# Patient Record
Sex: Female | Born: 1985 | Race: White | Hispanic: No | Marital: Single | Smoking: Never smoker
Health system: Southern US, Community
[De-identification: ages and names within clinical notes are randomized; demographics above are authoritative.]

## PROBLEM LIST (undated history)

## (undated) ENCOUNTER — Inpatient Hospital Stay (HOSPITAL_COMMUNITY): Payer: Self-pay

## (undated) DIAGNOSIS — Z8619 Personal history of other infectious and parasitic diseases: Secondary | ICD-10-CM

## (undated) DIAGNOSIS — O139 Gestational [pregnancy-induced] hypertension without significant proteinuria, unspecified trimester: Secondary | ICD-10-CM

## (undated) DIAGNOSIS — Z141 Cystic fibrosis carrier: Secondary | ICD-10-CM

## (undated) DIAGNOSIS — Q210 Ventricular septal defect: Secondary | ICD-10-CM

## (undated) DIAGNOSIS — Q211 Atrial septal defect: Secondary | ICD-10-CM

## (undated) DIAGNOSIS — R51 Headache: Secondary | ICD-10-CM

## (undated) DIAGNOSIS — E669 Obesity, unspecified: Secondary | ICD-10-CM

## (undated) DIAGNOSIS — E785 Hyperlipidemia, unspecified: Secondary | ICD-10-CM

## (undated) DIAGNOSIS — F5101 Primary insomnia: Secondary | ICD-10-CM

## (undated) DIAGNOSIS — Q2119 Other specified atrial septal defect: Secondary | ICD-10-CM

## (undated) DIAGNOSIS — R002 Palpitations: Secondary | ICD-10-CM

## (undated) HISTORY — DX: Ventricular septal defect: Q21.0

## (undated) HISTORY — DX: Headache: R51

## (undated) HISTORY — DX: Cystic fibrosis carrier: Z14.1

## (undated) HISTORY — PX: CARDIAC SURGERY: SHX584

## (undated) HISTORY — DX: Personal history of other infectious and parasitic diseases: Z86.19

## (undated) HISTORY — DX: Primary insomnia: F51.01

## (undated) HISTORY — DX: Palpitations: R00.2

## (undated) HISTORY — DX: Hyperlipidemia, unspecified: E78.5

---
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
## 2010-10-18 ENCOUNTER — Emergency Department (HOSPITAL_COMMUNITY): Payer: BC Managed Care – PPO

## 2010-10-18 ENCOUNTER — Emergency Department (HOSPITAL_COMMUNITY)
Admission: EM | Admit: 2010-10-18 | Discharge: 2010-10-18 | Disposition: A | Discharge status: Home / Self Care | Payer: BC Managed Care – PPO | Attending: Emergency Medicine | Admitting: Emergency Medicine

## 2010-10-18 DIAGNOSIS — R079 Chest pain, unspecified: Secondary | ICD-10-CM | POA: Insufficient documentation

## 2010-10-18 DIAGNOSIS — R0602 Shortness of breath: Secondary | ICD-10-CM | POA: Insufficient documentation

## 2010-10-18 DIAGNOSIS — Z951 Presence of aortocoronary bypass graft: Secondary | ICD-10-CM | POA: Insufficient documentation

## 2010-10-18 LAB — COMPREHENSIVE METABOLIC PANEL
ALT: 30 U/L (ref 0–35)
CO2: 29 mEq/L (ref 19–32)
Calcium: 10.2 mg/dL (ref 8.4–10.5)
Creatinine, Ser: 0.63 mg/dL (ref 0.4–1.2)
GFR calc Af Amer: >60 mL/min (ref >60)
GFR calc non Af Amer: >60 mL/min (ref >60)
Glucose, Bld: 91 mg/dL (ref 70–99)
Sodium: 138 mEq/L (ref 135–145)
Total Protein: 7.5 g/dL (ref 6.0–8.3)

## 2010-10-18 LAB — DIFFERENTIAL
Basophils Absolute: 0 10*3/uL (ref 0.0–0.1)
Basophils Relative: 0 % (ref 0–1)
Eosinophils Absolute: 0.2 10*3/uL (ref 0.0–0.7)
Monocytes Absolute: 0.8 10*3/uL (ref 0.1–1.0)
Neutro Abs: 6.1 10*3/uL (ref 1.7–7.7)
Neutrophils Relative %: 62 % (ref 43–77)

## 2010-10-18 LAB — CK TOTAL AND CKMB (NOT AT ARMC)
CK, MB: 2.4 ng/mL (ref 0.3–4.0)
Total CK: 85 U/L (ref 7–177)

## 2010-10-18 LAB — CBC
Hemoglobin: 12.1 g/dL (ref 12.0–15.0)
MCH: 31 pg (ref 26.0–34.0)
MCHC: 33.2 g/dL (ref 30.0–36.0)
Platelets: 275 10*3/uL (ref 150–400)
RBC: 3.9 MIL/uL (ref 3.87–5.11)

## 2010-10-18 NOTE — ED Provider Notes (Signed)
°

## 2010-10-18 NOTE — ED Notes (Signed)
°

## 2010-10-19 NOTE — Procedures (Signed)
°

## 2010-10-23 NOTE — Procedures (Signed)
°

## 2010-10-26 NOTE — Consent Form (Signed)
°

## 2010-10-26 NOTE — ED Notes (Signed)
°

## 2010-10-26 NOTE — Procedures (Signed)
°

## 2011-04-04 ENCOUNTER — Ambulatory Visit (HOSPITAL_COMMUNITY): Payer: 59 | Attending: Gynecology

## 2011-04-04 ENCOUNTER — Encounter: Payer: Self-pay | Admitting: *Deleted

## 2011-04-04 ENCOUNTER — Emergency Department (HOSPITAL_COMMUNITY)
Admission: EM | Admit: 2011-04-04 | Discharge: 2011-04-04 | Disposition: A | Discharge status: Home / Self Care | Payer: Self-pay | Source: Home / Self Care | Attending: Family Medicine | Admitting: Family Medicine

## 2011-04-04 DIAGNOSIS — J020 Streptococcal pharyngitis: Secondary | ICD-10-CM

## 2011-04-04 HISTORY — DX: Atrial septal defect: Q21.1

## 2011-04-04 HISTORY — DX: Other specified atrial septal defect: Q21.19

## 2011-04-04 LAB — POCT RAPID STREP A: Streptococcus, Group A Screen (Direct): POSITIVE — AB

## 2011-04-04 MED ORDER — AMOXICILLIN 500 MG PO CAPS: 500.0000 mg | ORAL_CAPSULE | Freq: Three times a day (TID) | ORAL | Status: AC

## 2011-04-04 NOTE — ED Notes (Signed)
Pt  Reports  sorethroat  Hurts  To  Swallow   Fever  earlier  This    Am      Dry  Cough

## 2011-04-04 NOTE — ED Provider Notes (Signed)
History     CSN: [id number] Arrival date & time: 04/04/2011  4:31 PM   First MD Initiated Contact with Patient 04/04/11 1603      Chief Complaint  Patient presents with   Sore Throat    (Consider location/radiation/quality/duration/timing/severity/associated sxs/prior treatment) Patient is a 25 y.o. female presenting with pharyngitis. The history is provided by the patient.  Sore Throat This is a new problem. The current episode started yesterday. The problem occurs constantly. The problem has been gradually worsening. The symptoms are aggravated by swallowing. She has tried nothing for the symptoms.    Past Medical History  Diagnosis Date   Atrial septal defect and mitral stenosis     Past Surgical History  Procedure Date   Cardiac surgery     Family History  Problem Relation Age of Onset   Hypertension Father    Stroke Father     History  Substance Use Topics   Smoking status: Never Smoker    Smokeless tobacco: Not on file   Alcohol Use: No    OB History    Grav Para Term Preterm Abortions TAB SAB Ect Mult Living                  Review of Systems  Constitutional: Positive for fever.  HENT: Positive for sore throat.   Gastrointestinal: Negative.   Hematological: Positive for adenopathy.    Allergies  Codeine  Home Medications   Current Outpatient Rx  Name Route Sig Dispense Refill   METFORMIN HCL 500 MG PO TABS Oral Take 500 mg by mouth 2 (two) times daily with a meal.        BP 102/70   Pulse 82   Temp(Src) 98.4 F (36.9 C) (Oral)   Resp 18   SpO2 99%   LMP 03/04/2011  Physical Exam  Nursing note and vitals reviewed. Constitutional: She appears well-developed and well-nourished.  HENT:  Head: Normocephalic.  Right Ear: External ear normal.  Left Ear: External ear normal.  Nose: Nose normal.  Mouth/Throat: Oropharyngeal exudate present.  Eyes: Conjunctivae and EOM are normal. Pupils are equal, round, and reactive to light.   Cardiovascular: Normal rate.   Pulmonary/Chest: Effort normal and breath sounds normal.  Lymphadenopathy:    She has cervical adenopathy.  Skin: Skin is warm and dry.    ED Course  Procedures (including critical care time)  Labs Reviewed  POCT RAPID STREP A ([facility] CARE ONLY) - Abnormal; Notable for the following:    Streptococcus, Group A Screen (Direct) POSITIVE (*)    All other components within normal limits   No results found.   No diagnosis found.    MDM  Strep pos        Barkley Bruns, MD 04/04/11 [phone]

## 2011-04-05 NOTE — Procedures (Signed)
°

## 2011-06-21 ENCOUNTER — Other Ambulatory Visit: Payer: Self-pay

## 2011-06-21 LAB — OB RESULTS CONSOLE ANTIBODY SCREEN: Antibody Screen: NEGATIVE

## 2011-06-21 LAB — OB RESULTS CONSOLE GC/CHLAMYDIA: Gonorrhea: NEGATIVE

## 2011-06-21 LAB — OB RESULTS CONSOLE HIV ANTIBODY (ROUTINE TESTING): HIV: NONREACTIVE

## 2011-06-21 LAB — OB RESULTS CONSOLE RUBELLA ANTIBODY, IGM: Rubella: IMMUNE

## 2011-07-10 LAB — OB RESULTS CONSOLE GC/CHLAMYDIA: Chlamydia: NEGATIVE

## 2011-08-07 ENCOUNTER — Other Ambulatory Visit (HOSPITAL_COMMUNITY): Payer: Self-pay | Admitting: Obstetrics and Gynecology

## 2011-08-07 DIAGNOSIS — Z8774 Personal history of (corrected) congenital malformations of heart and circulatory system: Secondary | ICD-10-CM

## 2011-08-24 ENCOUNTER — Inpatient Hospital Stay (HOSPITAL_COMMUNITY)
Admission: AD | Admit: 2011-08-24 | Discharge: 2011-08-25 | Disposition: A | Discharge status: Home / Self Care | Payer: 59 | Source: Ambulatory Visit | Attending: Obstetrics and Gynecology | Admitting: Obstetrics and Gynecology

## 2011-08-24 ENCOUNTER — Encounter (HOSPITAL_COMMUNITY): Payer: Self-pay | Admitting: Obstetrics and Gynecology

## 2011-08-24 DIAGNOSIS — F419 Anxiety disorder, unspecified: Secondary | ICD-10-CM

## 2011-08-24 DIAGNOSIS — F489 Nonpsychotic mental disorder, unspecified: Secondary | ICD-10-CM

## 2011-08-24 DIAGNOSIS — O99340 Other mental disorders complicating pregnancy, unspecified trimester: Secondary | ICD-10-CM | POA: Insufficient documentation

## 2011-08-24 DIAGNOSIS — F411 Generalized anxiety disorder: Secondary | ICD-10-CM | POA: Insufficient documentation

## 2011-08-24 NOTE — MAU Note (Signed)
"  I've just had this feeling of dread all day.  People keep asking me if I feel the baby moving yet and I don't know if I have or not.  I just having these chest pains and I feel shaky all over and nervous.  I do not have a h/o panic attacks or anxiety."

## 2011-08-25 NOTE — MAU Provider Note (Signed)
History     CSN: [id number]  Arrival date and time: 08/24/11 2245   First Provider Initiated Contact with Patient 08/25/11 0015     26 y.o.G1P0@[redacted]w[redacted]d  Chief Complaint  Patient presents with   Panic Attack   HPI Pt presents to MAU with report of anxiety and worry about pregnancy for last few days.  She has had chest tightness and feeling like she is having heart palpitations since she was crying earlier today.  She denies cramping/contractions, vaginal bleeding, LOF, vaginal itching/burning/discharge, urinary symptoms, n/v, h/a, or fever/chills.  She just states "I am a pessimistic person and since it took [place] so long to get pregnant, I am just waiting for something to go wrong".  "We have had a lot of prenatal visits and it has been almost 3 weeks since our last one and it has never been that long."  OB History    Grav Para Term Preterm Abortions TAB SAB Ect Mult Living   1               Past Medical History  Diagnosis Date   Atrial septal defect and mitral stenosis     Past Surgical History  Procedure Date   Cardiac surgery     Family History  Problem Relation Age of Onset   Hypertension Father    Stroke Father     History  Substance Use Topics   Smoking status: Never Smoker    Smokeless tobacco: Not on file   Alcohol Use: No    Allergies:  Allergies  Allergen Reactions   Codeine     Causes nausea.   Penicillins     Causes nausea.    Prescriptions prior to admission  Medication Sig Dispense Refill   calcium carbonate (TUMS EX) 750 MG chewable tablet Chew 2 tablets by mouth as needed.       Prenatal Vit-Fe Fumarate-FA (PRENATAL MULTIVITAMIN) TABS Take 1 tablet by mouth daily.        Review of Systems  Constitutional: Negative for fever, chills and malaise/fatigue.  Eyes: Negative for blurred vision.  Respiratory: Negative for cough and shortness of breath.   Cardiovascular: Negative for chest pain.  Gastrointestinal: Negative for heartburn,  nausea and vomiting.  Genitourinary: Negative for dysuria, urgency and frequency.  Musculoskeletal: Negative.   Neurological: Negative for dizziness and headaches.  Psychiatric/Behavioral: Negative for depression.  Pt denies hx of anxiety or depression but describes constant worry and pessimistic view of everything  Physical Exam   Blood pressure 129/66, pulse 71, temperature 97.9 F (36.6 C), temperature source Oral, resp. rate 20, height 5\' 6"  (1.676 m), weight 107.14 kg (236 lb 3.2 oz), last menstrual period 03/04/2011.  Physical Exam  Nursing note and vitals reviewed. Constitutional: She is oriented to person, place, and time. She appears well-developed and well-nourished.  Neck: Normal range of motion.  Cardiovascular: Normal rate, regular rhythm and normal heart sounds.   Respiratory: Effort normal and breath sounds normal.  GI: Soft.  Musculoskeletal: Normal range of motion.  Neurological: She is alert and oriented to person, place, and time.  Skin: Skin is warm and dry.  Psychiatric: She has a normal mood and affect. Her behavior is normal. Judgment and thought content normal.    MAU Course  Procedures EKG, bedside sono with pt reassured by fetal movement and FHR  EKG Normal sinus rhythm  Assessment and Plan  Anxiety in pregnancy  D/C home Discussed relaxation techniques and ways to relieve stress Reassured pt of normalcy  of most pregnancies F/U with prenatal provider Return to MAU as needed  LEFTWICH-KIRBY, Avraj Lindroth 08/25/2011, 12:41 AM

## 2011-08-25 NOTE — Discharge Instructions (Signed)
Pregnancy - Second Trimester The second trimester of pregnancy (3 to 6 months) is a period of rapid growth for you and your baby. At the end of the sixth month, your baby is about 9 inches long and weighs 1 1/2 pounds. You will begin to feel the baby move between 18 and 20 weeks of the pregnancy. This is called quickening. Weight gain is faster. A clear fluid (colostrum) may leak out of your breasts. You may feel small contractions of the womb (uterus). This is known as false labor or Braxton-Hicks contractions. This is like a practice for labor when the baby is ready to be born. Usually, the problems with morning sickness have usually passed by the end of your first trimester. Some women develop small dark blotches (called cholasma, mask of pregnancy) on their face that usually goes away after the baby is born. Exposure to the sun makes the blotches worse. Acne may also develop in some pregnant women and pregnant women who have acne, may find that it goes away. PRENATAL EXAMS  Blood work may continue to be done during prenatal exams. These tests are done to check on your health and the probable health of your baby. Blood work is used to follow your blood levels (hemoglobin). Anemia (low hemoglobin) is common during pregnancy. Iron and vitamins are given to help prevent this. You will also be checked for diabetes between 24 and 28 weeks of the pregnancy. Some of the previous blood tests may be repeated.   The size of the uterus is measured during each visit. This is to make sure that the baby is continuing to grow properly according to the dates of the pregnancy.   Your blood pressure is checked every prenatal visit. This is to make sure you are not getting toxemia.   Your urine is checked to make sure you do not have an infection, diabetes or protein in the urine.   Your weight is checked often to make sure gains are happening at the suggested rate. This is to ensure that both you and your baby are  growing normally.   Sometimes, an ultrasound is performed to confirm the proper growth and development of the baby. This is a test which bounces harmless sound waves off the baby so your caregiver can more accurately determine due dates.  Sometimes, a specialized test is done on the amniotic fluid surrounding the baby. This test is called an amniocentesis. The amniotic fluid is obtained by sticking a needle into the belly (abdomen). This is done to check the chromosomes in instances where there is a concern about possible genetic problems with the baby. It is also sometimes done near the end of pregnancy if an early delivery is required. In this case, it is done to help make sure the baby's lungs are mature enough for the baby to live outside of the womb. CHANGES OCCURING IN THE SECOND TRIMESTER OF PREGNANCY Your body goes through many changes during pregnancy. They vary from person to person. Talk to your caregiver about changes you notice that you are concerned about.  During the second trimester, you will likely have an increase in your appetite. It is normal to have cravings for certain foods. This varies from person to person and pregnancy to pregnancy.   Your lower abdomen will begin to bulge.   You may have to urinate more often because the uterus and baby are pressing on your bladder. It is also common to get more bladder infections during pregnancy (  pain with urination). You can help this by drinking lots of fluids and emptying your bladder before and after intercourse.   You may begin to get stretch marks on your hips, abdomen, and breasts. These are normal changes in the body during pregnancy. There are no exercises or medications to take that prevent this change.   You may begin to develop swollen and bulging veins (varicose veins) in your legs. Wearing support hose, elevating your feet for 15 minutes, 3 to 4 times a day and limiting salt in your diet helps lessen the problem.    Heartburn may develop as the uterus grows and pushes up against the stomach. Antacids recommended by your caregiver helps with this problem. Also, eating smaller meals 4 to 5 times a day helps.   Constipation can be treated with a stool softener or adding bulk to your diet. Drinking lots of fluids, vegetables, fruits, and whole grains are helpful.   Exercising is also helpful. If you have been very active up until your pregnancy, most of these activities can be continued during your pregnancy. If you have been less active, it is helpful to start an exercise program such as walking.   Hemorrhoids (varicose veins in the rectum) may develop at the end of the second trimester. Warm sitz baths and hemorrhoid cream recommended by your caregiver helps hemorrhoid problems.   Backaches may develop during this time of your pregnancy. Avoid heavy lifting, wear low heal shoes and practice good posture to help with backache problems.   Some pregnant women develop tingling and numbness of their hand and fingers because of swelling and tightening of ligaments in the wrist (carpel tunnel syndrome). This goes away after the baby is born.   As your breasts enlarge, you may have to get a bigger bra. Get a comfortable, cotton, support bra. Do not get a nursing bra until the last month of the pregnancy if you will be nursing the baby.   You may get a dark line from your belly button to the pubic area called the linea nigra.   You may develop rosy cheeks because of increase blood flow to the face.   You may develop spider looking lines of the face, neck, arms and chest. These go away after the baby is born.  HOME CARE INSTRUCTIONS   It is extremely important to avoid all smoking, herbs, alcohol, and unprescribed drugs during your pregnancy. These chemicals affect the formation and growth of the baby. Avoid these chemicals throughout the pregnancy to ensure the delivery of a healthy infant.   Most of your home  care instructions are the same as suggested for the first trimester of your pregnancy. Keep your caregiver's appointments. Follow your caregiver's instructions regarding medication use, exercise and diet.   During pregnancy, you are providing food for you and your baby. Continue to eat regular, well-balanced meals. Choose foods such as meat, fish, milk and other low fat dairy products, vegetables, fruits, and whole-grain breads and cereals. Your caregiver will tell you of the ideal weight gain.   A physical sexual relationship may be continued up until near the end of pregnancy if there are no other problems. Problems could include early (premature) leaking of amniotic fluid from the membranes, vaginal bleeding, abdominal pain, or other medical or pregnancy problems.   Exercise regularly if there are no restrictions. Check with your caregiver if you are unsure of the safety of some of your exercises. The greatest weight gain will occur in the   last 2 trimesters of pregnancy. Exercise will help you:   Control your weight.   Get you in shape for labor and delivery.   Lose weight after you have the baby.   Wear a good support or jogging bra for breast tenderness during pregnancy. This may help if worn during sleep. Pads or tissues may be used in the bra if you are leaking colostrum.   Do not use hot tubs, steam rooms or saunas throughout the pregnancy.   Wear your seat belt at all times when driving. This protects you and your baby if you are in an accident.   Avoid raw meat, uncooked cheese, cat litter boxes and soil used by cats. These carry germs that can cause birth defects in the baby.   The second trimester is also a good time to visit your dentist for your dental health if this has not been done yet. Getting your teeth cleaned is OK. Use a soft toothbrush. Brush gently during pregnancy.   It is easier to loose urine during pregnancy. Tightening up and strengthening the pelvic muscles will  help with this problem. Practice stopping your urination while you are going to the bathroom. These are the same muscles you need to strengthen. It is also the muscles you would use as if you were trying to stop from passing gas. You can practice tightening these muscles up 10 times a set and repeating this about 3 times per day. Once you know what muscles to tighten up, do not perform these exercises during urination. It is more likely to contribute to an infection by backing up the urine.   Ask for help if you have financial, counseling or nutritional needs during pregnancy. Your caregiver will be able to offer counseling for these needs as well as refer you for other special needs.   Your skin may become oily. If so, wash your face with mild soap, use non-greasy moisturizer and oil or cream based makeup.  MEDICATIONS AND DRUG USE IN PREGNANCY  Take prenatal vitamins as directed. The vitamin should contain 1 milligram of folic acid. Keep all vitamins out of reach of children. Only a couple vitamins or tablets containing iron may be fatal to a baby or young child when ingested.   Avoid use of all medications, including herbs, over-the-counter medications, not prescribed or suggested by your caregiver. Only take over-the-counter or prescription medicines for pain, discomfort, or fever as directed by your caregiver. Do not use aspirin.   Let your caregiver also know about herbs you may be using.   Alcohol is related to a number of birth defects. This includes fetal alcohol syndrome. All alcohol, in any form, should be avoided completely. Smoking will cause low birth rate and premature babies.   Street or illegal drugs are very harmful to the baby. They are absolutely forbidden. A baby born to an addicted mother will be addicted at birth. The baby will go through the same withdrawal an adult does.  SEEK MEDICAL CARE IF:  You have any concerns or worries during your pregnancy. It is better to call with  your questions if you feel they cannot wait, rather than worry about them. SEEK IMMEDIATE MEDICAL CARE IF:   An unexplained oral temperature above 102 F (38.9 C) develops, or as your caregiver suggests.   You have leaking of fluid from the vagina (birth canal). If leaking membranes are suspected, take your temperature and tell your caregiver of this when you call.   There   is vaginal spotting, bleeding, or passing clots. Tell your caregiver of the amount and how many pads are used. Light spotting in pregnancy is common, especially following intercourse.   You develop a bad smelling vaginal discharge with a change in the color from clear to white.   You continue to feel sick to your stomach (nauseated) and have no relief from remedies suggested. You vomit blood or coffee ground-like materials.   You lose more than 2 pounds of weight or gain more than 2 pounds of weight over 1 week, or as suggested by your caregiver.   You notice swelling of your face, hands, feet, or legs.   You get exposed to [place] measles and have never had them.   You are exposed to fifth disease or chickenpox.   You develop belly (abdominal) pain. Round ligament discomfort is a common non-cancerous (benign) cause of abdominal pain in pregnancy. Your caregiver still must evaluate you.   You develop a bad headache that does not go away.   You develop fever, diarrhea, pain with urination, or shortness of breath.   You develop visual problems, blurry, or double vision.   You fall or are in a car accident or any kind of trauma.   There is mental or physical violence at home.  Document Released: 04/17/2001 Document Revised: 04/12/2011 Document Reviewed: 10/20/2008 [facility] Patient Information 2012 [place], [place].  Anxiety and Panic Attacks Your caregiver has informed you that you are having an anxiety or panic attack. There may be many forms of this. Most of the time these attacks come suddenly and without  warning. They come at any time of day, including periods of sleep, and at any time of life. They may be strong and unexplained. Although panic attacks are very scary, they are physically harmless. Sometimes the cause of your anxiety is not known. Anxiety is a protective mechanism of the body in its fight or flight mechanism. Most of these perceived danger situations are actually nonphysical situations (such as anxiety over losing a job). CAUSES  The causes of an anxiety or panic attack are many. Panic attacks may occur in otherwise healthy people given a certain set of circumstances. There may be a genetic cause for panic attacks. Some medications may also have anxiety as a side effect. SYMPTOMS  Some of the most common feelings are:  Intense terror.   Dizziness, feeling faint.   Hot and cold flashes.   Fear of going crazy.   Feelings that nothing is real.   Sweating.   Shaking.   Chest pain or a fast heartbeat (palpitations).   Smothering, choking sensations.   Feelings of impending doom and that death is near.   Tingling of extremities, this may be from over-breathing.   Altered reality (derealization).   Being detached from yourself (depersonalization).  Several symptoms can be present to make up anxiety or panic attacks. DIAGNOSIS  The evaluation by your caregiver will depend on the type of symptoms you are experiencing. The diagnosis of anxiety or panic attack is made when no physical illness can be determined to be a cause of the symptoms. TREATMENT  Treatment to prevent anxiety and panic attacks may include:  Avoidance of circumstances that cause anxiety.   Reassurance and relaxation.   Regular exercise.   Relaxation therapies, such as yoga.   Psychotherapy with a psychiatrist or therapist.   Avoidance of caffeine, alcohol and illegal drugs.   Prescribed medication.  SEEK IMMEDIATE MEDICAL CARE IF:   You experience  panic attack symptoms that are different  than your usual symptoms.   You have any worsening or concerning symptoms.  Document Released: 04/23/2005 Document Revised: 04/12/2011 Document Reviewed: 08/25/2009 [facility] Patient Information 2012 [place], [place].

## 2011-09-04 ENCOUNTER — Encounter (HOSPITAL_COMMUNITY): Payer: Self-pay

## 2011-09-04 ENCOUNTER — Ambulatory Visit (HOSPITAL_COMMUNITY)
Admission: RE | Admit: 2011-09-04 | Discharge: 2011-09-04 | Disposition: A | Discharge status: Home / Self Care | Payer: 59 | Source: Ambulatory Visit | Attending: Obstetrics and Gynecology | Admitting: Obstetrics and Gynecology

## 2011-09-04 DIAGNOSIS — O358XX0 Maternal care for other (suspected) fetal abnormality and damage, not applicable or unspecified: Secondary | ICD-10-CM | POA: Insufficient documentation

## 2011-09-04 DIAGNOSIS — O352XX0 Maternal care for (suspected) hereditary disease in fetus, not applicable or unspecified: Secondary | ICD-10-CM | POA: Insufficient documentation

## 2011-09-04 DIAGNOSIS — Z8774 Personal history of (corrected) congenital malformations of heart and circulatory system: Secondary | ICD-10-CM

## 2011-09-04 DIAGNOSIS — Z1389 Encounter for screening for other disorder: Secondary | ICD-10-CM | POA: Insufficient documentation

## 2011-09-04 DIAGNOSIS — Z363 Encounter for antenatal screening for malformations: Secondary | ICD-10-CM | POA: Insufficient documentation

## 2011-10-21 ENCOUNTER — Encounter (HOSPITAL_COMMUNITY): Payer: Self-pay | Admitting: *Deleted

## 2011-10-21 ENCOUNTER — Inpatient Hospital Stay (HOSPITAL_COMMUNITY)
Admission: AD | Admit: 2011-10-21 | Discharge: 2011-10-21 | Disposition: A | Discharge status: Home / Self Care | Payer: 59 | Source: Ambulatory Visit | Attending: Obstetrics and Gynecology | Admitting: Obstetrics and Gynecology

## 2011-10-21 DIAGNOSIS — M549 Dorsalgia, unspecified: Secondary | ICD-10-CM | POA: Insufficient documentation

## 2011-10-21 DIAGNOSIS — O479 False labor, unspecified: Secondary | ICD-10-CM

## 2011-10-21 DIAGNOSIS — R109 Unspecified abdominal pain: Secondary | ICD-10-CM | POA: Insufficient documentation

## 2011-10-21 DIAGNOSIS — O99891 Other specified diseases and conditions complicating pregnancy: Secondary | ICD-10-CM | POA: Insufficient documentation

## 2011-10-21 LAB — URINALYSIS, ROUTINE W REFLEX MICROSCOPIC
Hgb urine dipstick: NEGATIVE
Leukocytes, UA: NEGATIVE
Nitrite: NEGATIVE
Protein, ur: NEGATIVE mg/dL
Specific Gravity, Urine: <1.005 — ABNORMAL LOW (ref 1.005–1.030)
Urobilinogen, UA: 0.2 mg/dL (ref 0.0–1.0)

## 2011-10-21 NOTE — MAU Note (Signed)
"  I'm having some abd pain and pressure since 1300.  It hadn't been very consistent, but worrisome."

## 2011-10-21 NOTE — Discharge Instructions (Signed)
Braxton Hicks Contractions Pregnancy is commonly associated with contractions of the uterus throughout the pregnancy. Towards the end of pregnancy (32 to 34 weeks), these contractions (Braxton Hicks) can develop more often and may become more forceful. This is not true labor because these contractions do not result in opening (dilatation) and thinning of the cervix. They are sometimes difficult to tell apart from true labor because these contractions can be forceful and people have different pain tolerances. You should not feel embarrassed if you go to the hospital with false labor. Sometimes, the only way to tell if you are in true labor is for your caregiver to follow the changes in the cervix. How to tell the difference between true and false labor:  False labor.   The contractions of false labor are usually shorter, irregular and not as hard as those of true labor.   They are often felt in the front of the lower abdomen and in the groin.   They may leave with walking around or changing positions while lying down.   They get weaker and are shorter lasting as time goes on.   These contractions are usually irregular.   They do not usually become progressively stronger, regular and closer together as with true labor.   True labor.   Contractions in true labor last 30 to 70 seconds, become very regular, usually become more intense, and increase in frequency.   They do not go away with walking.   The discomfort is usually felt in the top of the uterus and spreads to the lower abdomen and low back.   True labor can be determined by your caregiver with an exam. This will show that the cervix is dilating and getting thinner.  If there are no prenatal problems or other health problems associated with the pregnancy, it is completely safe to be sent home with false labor and await the onset of true labor. HOME CARE INSTRUCTIONS   Keep up with your usual exercises and instructions.   Take  medications as directed.   Keep your regular prenatal appointment.   Eat and drink lightly if you think you are going into labor.   If BH contractions are making you uncomfortable:   Change your activity position from lying down or resting to walking/walking to resting.   Sit and rest in a tub of warm water.   Drink 2 to 3 glasses of water. Dehydration may cause B-H contractions.   Do slow and deep breathing several times an hour.  SEEK IMMEDIATE MEDICAL CARE IF:   Your contractions continue to become stronger, more regular, and closer together.   You have a gushing, burst or leaking of fluid from the vagina.   An oral temperature above 102 F (38.9 C) develops.   You have passage of blood-tinged mucus.   You develop vaginal bleeding.   You develop continuous belly (abdominal) pain.   You have low back pain that you never had before.   You feel the baby's head pushing down causing pelvic pressure.   The baby is not moving as much as it used to.  Document Released: 04/23/2005 Document Revised: 04/12/2011 Document Reviewed: 10/15/2008 Exit[employer] Patient Information 2012 [employer].Preterm Labor Preterm labor is when labor starts at less than 37 weeks of pregnancy. The normal length of a pregnancy is 39 to 41 weeks. CAUSES Often, there is no identifiable underlying cause as to why a woman goes into preterm labor. However, one of the most common known causes   of preterm labor is infection. Infections of the uterus, cervix, vagina, amniotic sac, bladder, kidney, or even the lungs (pneumonia) can cause labor to start. Other causes of preterm labor include:  Urogenital infections, such as yeast infections and bacterial vaginosis.   Uterine abnormalities (uterine shape, uterine septum, fibroids, bleeding from the placenta).   A cervix that has been operated on and opens prematurely.   Malformations in the baby.   Multiple gestations (twins, triplets, and so on).    Breakage of the amniotic sac.  Additional risk factors for preterm labor include:  Previous history of preterm labor.   Premature rupture of membranes (PROM).   A placenta that covers the opening of the cervix (placenta previa).   A placenta that separates from the uterus (placenta abruption).   A cervix that is too weak to hold the baby in the uterus (incompetence cervix).   Having too much fluid in the amniotic sac (polyhydramnios).   Taking illegal drugs or smoking while pregnant.   Not gaining enough weight while pregnant.   Women younger than 18 and older than 27 years old.   Low socioeconomic status.   African-American ethnicity.  SYMPTOMS Signs and symptoms of preterm labor include:  Menstrual-like cramps.   Contractions that are 30 to 70 seconds apart, become very regular, closer together, and are more intense and painful.   Contractions that start on the top of the uterus and spread down to the lower abdomen and back.   A sense of increased pelvic pressure or back pain.   A watery or bloody discharge that comes from the vagina.  DIAGNOSIS  A diagnosis can be confirmed by:  A vaginal exam.   An ultrasound of the cervix.   Sampling (swabbing) cervico-vaginal secretions. These samples can be tested for the presence of fetal fibronectin. This is a protein found in cervical discharge which is associated with preterm labor.   Fetal monitoring.  TREATMENT  Depending on the length of the pregnancy and other circumstances, a caregiver may suggest bed rest. If necessary, there are medicines that can be given to stop contractions and to quicken fetal lung maturity. If labor happens before 34 weeks of pregnancy, a prolonged hospital stay may be recommended. Treatment depends on the condition of both the mother and baby. PREVENTION There are some things a mother can do to lower the risk of preterm labor in future pregnancies. A woman can:   Stop smoking.    Maintain healthy weight gain and avoid chemicals and drugs that are not necessary.   Be watchful for any type of infection.   Inform her caregiver if she has a known history of preterm labor.  Document Released: 07/14/2003 Document Revised: 04/12/2011 Document Reviewed: 08/18/2010 Exit[employer] Patient Information 2012 [employer]. 

## 2011-10-21 NOTE — MAU Provider Note (Signed)
Chief Complaint:  Back Pain and Abdominal Cramping    First Provider Initiated Contact with Patient 10/21/11 2122      Kim Sharp is  26 y.o. G1P0.  Patient's last menstrual period was 04/25/2011..  [redacted]w[redacted]d  She presents complaining of Back Pain and Abdominal Cramping . Onset is described as sudden back pain and lower abd cramping at 1300 today, resolved spontaneously. Reports good FM, denies bleeding or LOL. Next OB appt with Dr. Renaldo Fiddler in 2 weeks.  Obstetrical/Gynecological History: OB History    Grav Para Term Preterm Abortions TAB SAB Ect Mult Living   1               Past Medical History: Past Medical History  Diagnosis Date  . Atrial septal defect and mitral stenosis     Past Surgical History: Past Surgical History  Procedure Date  . Cardiac surgery     Family History: Family History  Problem Relation Age of Onset  . Hypertension Father   . Stroke Father     Social History: History  Substance Use Topics  . Smoking status: Never Smoker   . Smokeless tobacco: Not on file  . Alcohol Use: No    Allergies:  Allergies  Allergen Reactions  . Codeine     Causes nausea.  . Penicillins     Causes nausea.    Prescriptions prior to admission  Medication Sig Dispense Refill  . acetaminophen (TYLENOL) 325 MG tablet Take 325 mg by mouth every 6 (six) hours as needed. pain      . calcium carbonate (TUMS EX) 750 MG chewable tablet Chew 2 tablets by mouth as needed.      . Prenatal Vit-Fe Fumarate-FA (PRENATAL MULTIVITAMIN) TABS Take 1 tablet by mouth at bedtime.         Review of Systems - Negative except what has been reviewed in the HPI   Physical Exam   Blood pressure 136/69, pulse 87, temperature 97.4 F (36.3 C), temperature source Oral, resp. rate 18, height 5\' 6"  (1.676 m), weight 245 lb (111.131 kg), last menstrual period 04/25/2011.  General: General appearance - alert, well appearing, and in no distress, oriented to person, place, and time and  overweight Mental status - alert, oriented to person, place, and time, normal mood, behavior, speech, dress, motor activity, and thought processes, affect appropriate to mood Abdomen - obese, gravid, soft, non tender Focused Gynecological Exam: CERVIX: closed/thick/post  Labs: Recent Results (from the past 24 hour(s))  URINALYSIS, ROUTINE W REFLEX MICROSCOPIC   Collection Time   10/21/11  7:24 PM      Component Value Range   Color, Urine YELLOW  YELLOW   APPearance CLEAR  CLEAR   Specific Gravity, Urine <1.005 (*) 1.005 - 1.030   pH 6.5  5.0 - 8.0   Glucose, UA NEGATIVE  NEGATIVE mg/dL   Hgb urine dipstick NEGATIVE  NEGATIVE   Bilirubin Urine NEGATIVE  NEGATIVE   Ketones, ur NEGATIVE  NEGATIVE mg/dL   Protein, ur NEGATIVE  NEGATIVE mg/dL   Urobilinogen, UA 0.2  0.0 - 1.0 mg/dL   Nitrite NEGATIVE  NEGATIVE   Leukocytes, UA NEGATIVE  NEGATIVE   MD Consult: Discussed with Dr. Marcelle Overlie  Assessment: Back pain in pregnancy Anxiety  Plan: Discharge home PTL precautions FU in office as scheduled  [name] E. 10/21/2011,9:31 PM

## 2011-10-21 NOTE — MAU Note (Signed)
Pt G1 at 25.4wks having lower back pain and lower abd cramping since 1300 today.  Pain comes and goes.  Denies bleeding or leaking.

## 2011-11-08 ENCOUNTER — Encounter (HOSPITAL_COMMUNITY): Payer: Self-pay | Admitting: *Deleted

## 2011-11-08 ENCOUNTER — Inpatient Hospital Stay (HOSPITAL_COMMUNITY)
Admission: AD | Admit: 2011-11-08 | Discharge: 2011-11-09 | Disposition: A | Discharge status: Home / Self Care | Payer: 59 | Source: Ambulatory Visit | Attending: Obstetrics and Gynecology | Admitting: Obstetrics and Gynecology

## 2011-11-08 DIAGNOSIS — O368190 Decreased fetal movements, unspecified trimester, not applicable or unspecified: Secondary | ICD-10-CM

## 2011-11-08 NOTE — MAU Note (Signed)
No fetal movement since 1700 today. Denies contractions, vaginal bleeding or discharge. Denies complications with pregnancy.

## 2011-11-09 DIAGNOSIS — O368190 Decreased fetal movements, unspecified trimester, not applicable or unspecified: Secondary | ICD-10-CM

## 2011-11-09 NOTE — MAU Provider Note (Signed)
°  History     CSN: [id number]  Arrival date and time: 11/08/11 2318   None     No chief complaint on file.  HPI This is a 26 year old G1 P0 at 51 weeks and one-day who presents the MAU with decreased fetal activity since 5:00 last night. She has not felt any movement today. She tried to drink soda and do kick counts but did not feel the baby move. She came over here from work to be evaluated. No palliating or provoking factors. Denies contractions, nausea, vomiting, leaking fluid, vaginal discharge.  OB History    Grav Para Term Preterm Abortions TAB SAB Ect Mult Living   1               Past Medical History  Diagnosis Date   Atrial septal defect and mitral stenosis     Past Surgical History  Procedure Date   Cardiac surgery     Family History  Problem Relation Age of Onset   Hypertension Father    Stroke Father     History  Substance Use Topics   Smoking status: Never Smoker    Smokeless tobacco: Not on file   Alcohol Use: No    Allergies:  Allergies  Allergen Reactions   Codeine     Causes nausea.   Penicillins     Causes nausea.    Prescriptions prior to admission  Medication Sig Dispense Refill   calcium carbonate (TUMS EX) 750 MG chewable tablet Chew 2 tablets by mouth as needed.       Prenatal Vit-Fe Fumarate-FA (PRENATAL MULTIVITAMIN) TABS Take 1 tablet by mouth at bedtime.        acetaminophen (TYLENOL) 325 MG tablet Take 325 mg by mouth every 6 (six) hours as needed. pain        Review of Systems  All other systems reviewed and are negative.   Physical Exam   Last menstrual period 04/25/2011.  Physical Exam  Constitutional: She is oriented to person, place, and time. She appears well-developed and well-nourished.  HENT:  Head: Normocephalic and atraumatic.  GI: Soft. She exhibits no distension and no mass. There is no tenderness. There is no rebound and no guarding.  Neurological: She is alert and oriented to person, place,  and time.  Skin: Skin is warm and dry.  Psychiatric: She has a normal mood and affect. Her behavior is normal. Judgment and thought content normal.    MAU Course  Procedures NST: Baseline rate of 120s to 30s. Category 1 tracing. Accelerations seen. No contractions on tocometry. Fetal movement heard On Doppler, which the patient felt.  MDM   Assessment and Plan  #1 decreased fetal activity  Patient reassured with feeling movement in combination with fetal heart tones. Patient discharged to home with kick count instructions. Patient to followup with primary obstetrician scheduled appointment. Patient to return with decreased fetal activity.  Mayara Paulson JEHIEL 11/09/2011, 12:12 AM

## 2011-11-09 NOTE — Consent Form (Signed)
°

## 2011-11-09 NOTE — Miscellaneous (Signed)
°

## 2011-11-09 NOTE — Consent Form (Deleted)
°

## 2011-12-24 LAB — OB RESULTS CONSOLE GBS: GBS: POSITIVE

## 2012-01-14 ENCOUNTER — Inpatient Hospital Stay (HOSPITAL_COMMUNITY)
Admission: AD | Admit: 2012-01-14 | Discharge: 2012-01-14 | Disposition: A | Discharge status: Home / Self Care | Payer: 59 | Source: Ambulatory Visit | Attending: Obstetrics and Gynecology | Admitting: Obstetrics and Gynecology

## 2012-01-14 ENCOUNTER — Other Ambulatory Visit: Payer: Self-pay

## 2012-01-14 ENCOUNTER — Encounter (HOSPITAL_COMMUNITY): Payer: Self-pay | Admitting: *Deleted

## 2012-01-14 DIAGNOSIS — O479 False labor, unspecified: Secondary | ICD-10-CM | POA: Insufficient documentation

## 2012-01-14 DIAGNOSIS — R079 Chest pain, unspecified: Secondary | ICD-10-CM | POA: Insufficient documentation

## 2012-01-14 DIAGNOSIS — R109 Unspecified abdominal pain: Secondary | ICD-10-CM | POA: Insufficient documentation

## 2012-01-14 DIAGNOSIS — O99891 Other specified diseases and conditions complicating pregnancy: Secondary | ICD-10-CM | POA: Insufficient documentation

## 2012-01-14 MED ORDER — GI COCKTAIL ~~LOC~~
30.0000 mL | Freq: Once | ORAL | Status: AC
  Administered 2012-01-14: 30 mL via ORAL
  Filled 2012-01-14: qty 30

## 2012-01-14 NOTE — MAU Note (Signed)
Pt reports contractions all day off/on, this pm had onset of severe pain that was constant. Now having contractions about 7 minutes, pain radiates into her lower back. With every contraction has pressure in her chest and nausea.

## 2012-01-14 NOTE — MAU Provider Note (Signed)
°  History     CSN: [id number]  Arrival date and time: 01/14/12 2016   First Provider Initiated Contact with Patient 01/14/12 2101      Chief Complaint  Patient presents with   Labor Eval   HPI Valicia Rief is a 26 y.o. female @ [redacted]w[redacted]d gestation who presents to MAU with chest pain and contractions. She states that the contractions started earlier today and were about every 8 minutes then tonight are closer. The chest pain comes during the contractions and feels like a ton of bricks sitting on the chest. Hx of VSD and ASD. Surgery years ago and was released by the cardiologist. The history was provided by the patient.  OB History    Grav Para Term Preterm Abortions TAB SAB Ect Mult Living   1               Past Medical History  Diagnosis Date   Atrial septal defect and mitral stenosis     Past Surgical History  Procedure Date   Cardiac surgery     Family History  Problem Relation Age of Onset   Hypertension Father    Stroke Father     History  Substance Use Topics   Smoking status: Never Smoker    Smokeless tobacco: Never Used   Alcohol Use: No    Allergies:  Allergies  Allergen Reactions   Codeine Nausea Only    Causes nausea.   Penicillins Nausea Only    Causes nausea.    Prescriptions prior to admission  Medication Sig Dispense Refill   calcium carbonate (TUMS EX) 750 MG chewable tablet Chew 1 tablet by mouth 2 (two) times daily as needed. For heartburn       Prenatal Vit-Fe Fumarate-FA (PRENATAL MULTIVITAMIN) TABS Take 1 tablet by mouth at bedtime.         ROS: As stated in HPI Blood pressure 134/95, pulse 94, temperature 97.8 F (36.6 C), temperature source Oral, resp. rate 20, height 5\' 7"  (1.702 m), weight 264 lb (119.75 kg), last menstrual period 04/25/2011, SpO2 100.00%.  Physical Exam  Nursing note and vitals reviewed. Constitutional: She is oriented to person, place, and time. She appears well-developed and well-nourished. No  distress.       BP elevated initially.  HENT:  Head: Normocephalic.  Cardiovascular: Normal rate and regular rhythm.   Respiratory: Effort normal. She has no wheezes. She has no rales.  GI: Soft. There is no tenderness.       Gravid consistent with dates  Genitourinary:       Dilation: Fingertip Effacement (%): Thick Cervical Position: Posterior (to left) Station: -3 Presentation: Vertex Exam by:: L. Paschal, RN  Musculoskeletal: Normal range of motion.  Neurological: She is alert and oriented to person, place, and time.  Skin: Skin is warm and dry.  Psychiatric: She has a normal mood and affect. Her behavior is normal. Judgment and thought content normal.   EKG: normal Assessment: 26 y.o. female @ [redacted]w[redacted]d gestation with abdominal pain    Chest pain only with contractions  Plan:  Medical screening exam complete and Dr. Marcelle Overlie to continue care of patient.    RN to discuss monitor tracing and cervical exam with Dr. Marcelle Overlie MAU Course  Procedures  Lylie Blacklock, RN, FNP, [facility] 01/14/2012, 9:07 PM

## 2012-01-15 NOTE — Consent Form (Signed)
°

## 2012-01-15 NOTE — Miscellaneous (Signed)
°

## 2012-01-15 NOTE — Procedures (Signed)
°

## 2012-01-19 ENCOUNTER — Encounter (HOSPITAL_COMMUNITY): Payer: Self-pay | Admitting: *Deleted

## 2012-01-19 ENCOUNTER — Inpatient Hospital Stay (HOSPITAL_COMMUNITY)
Admission: AD | Admit: 2012-01-19 | Discharge: 2012-01-19 | Disposition: A | Discharge status: Home / Self Care | Payer: 59 | Source: Ambulatory Visit | Attending: Obstetrics and Gynecology | Admitting: Obstetrics and Gynecology

## 2012-01-19 DIAGNOSIS — O479 False labor, unspecified: Secondary | ICD-10-CM | POA: Insufficient documentation

## 2012-01-19 NOTE — MAU Note (Signed)
Pt presents for bloody show, perineal pressure and contractions.

## 2012-01-20 NOTE — Miscellaneous (Signed)
°

## 2012-01-22 ENCOUNTER — Encounter (HOSPITAL_COMMUNITY): Payer: Self-pay | Admitting: *Deleted

## 2012-01-22 ENCOUNTER — Telehealth (HOSPITAL_COMMUNITY): Payer: Self-pay | Admitting: *Deleted

## 2012-01-22 NOTE — Telephone Encounter (Signed)
Preadmission screen ° °

## 2012-01-24 ENCOUNTER — Encounter (HOSPITAL_COMMUNITY): Payer: Self-pay

## 2012-01-24 ENCOUNTER — Inpatient Hospital Stay (HOSPITAL_COMMUNITY)
Admission: RE | Admit: 2012-01-24 | Discharge: 2012-01-28 | DRG: 765 | Disposition: A | Discharge status: Home / Self Care | Payer: 59 | Source: Ambulatory Visit | Attending: Obstetrics and Gynecology | Admitting: Obstetrics and Gynecology

## 2012-01-24 DIAGNOSIS — O339 Maternal care for disproportion, unspecified: Secondary | ICD-10-CM | POA: Diagnosis present

## 2012-01-24 DIAGNOSIS — O139 Gestational [pregnancy-induced] hypertension without significant proteinuria, unspecified trimester: Secondary | ICD-10-CM | POA: Diagnosis present

## 2012-01-24 DIAGNOSIS — Z22330 Carrier of Group B streptococcus: Secondary | ICD-10-CM

## 2012-01-24 DIAGNOSIS — O330 Maternal care for disproportion due to deformity of maternal pelvic bones: Principal | ICD-10-CM | POA: Diagnosis present

## 2012-01-24 DIAGNOSIS — O99892 Other specified diseases and conditions complicating childbirth: Secondary | ICD-10-CM | POA: Diagnosis present

## 2012-01-24 LAB — CBC
HCT: 36.4 % (ref 36.0–46.0)
Hemoglobin: 12 g/dL (ref 12.0–15.0)
MCH: 30.3 pg (ref 26.0–34.0)
MCV: 91.9 fL (ref 78.0–100.0)
RBC: 3.96 MIL/uL (ref 3.87–5.11)
WBC: 9.7 10*3/uL (ref 4.0–10.5)

## 2012-01-24 LAB — TYPE AND SCREEN
ABO/RH(D): B POS
Antibody Screen: NEGATIVE

## 2012-01-24 LAB — ABO/RH: ABO/RH(D): B POS

## 2012-01-24 MED ORDER — IBUPROFEN 600 MG PO TABS: 600.0000 mg | ORAL_TABLET | Freq: Four times a day (QID) | ORAL | Status: DC | PRN

## 2012-01-24 MED ORDER — OXYTOCIN 40 UNITS IN LACTATED RINGERS INFUSION - SIMPLE MED: 62.5000 mL/h | Freq: Once | INTRAVENOUS | Status: DC

## 2012-01-24 MED ORDER — LACTATED RINGERS IV SOLN
INTRAVENOUS | Status: DC
  Administered 2012-01-25 (×4): via INTRAVENOUS

## 2012-01-24 MED ORDER — TERBUTALINE SULFATE 1 MG/ML IJ SOLN: 0.2500 mg | Freq: Once | INTRAMUSCULAR | Status: AC | PRN

## 2012-01-24 MED ORDER — CITRIC ACID-SODIUM CITRATE 334-500 MG/5ML PO SOLN
30.0000 mL | ORAL | Status: DC | PRN
  Administered 2012-01-26: 30 mL via ORAL
  Filled 2012-01-24: qty 15

## 2012-01-24 MED ORDER — OXYCODONE-ACETAMINOPHEN 5-325 MG PO TABS: 1.0000 | ORAL_TABLET | ORAL | Status: DC | PRN

## 2012-01-24 MED ORDER — MISOPROSTOL 25 MCG QUARTER TABLET
25.0000 ug | ORAL_TABLET | ORAL | Status: DC | PRN
  Administered 2012-01-24 – 2012-01-25 (×3): 25 ug via VAGINAL
  Filled 2012-01-24 (×3): qty 0.25

## 2012-01-24 MED ORDER — ONDANSETRON HCL 4 MG/2ML IJ SOLN
4.0000 mg | Freq: Four times a day (QID) | INTRAMUSCULAR | Status: DC | PRN
  Administered 2012-01-26: 4 mg via INTRAVENOUS
  Filled 2012-01-24: qty 2

## 2012-01-24 MED ORDER — ACETAMINOPHEN 325 MG PO TABS
650.0000 mg | ORAL_TABLET | ORAL | Status: DC | PRN
  Administered 2012-01-25: 650 mg via ORAL
  Filled 2012-01-24: qty 2

## 2012-01-24 MED ORDER — OXYTOCIN BOLUS FROM INFUSION
500.0000 mL | Freq: Once | INTRAVENOUS | Status: DC
  Filled 2012-01-24: qty 500

## 2012-01-24 MED ORDER — LIDOCAINE HCL (PF) 1 % IJ SOLN
30.0000 mL | INTRAMUSCULAR | Status: DC | PRN
  Filled 2012-01-24: qty 30

## 2012-01-24 MED ORDER — LACTATED RINGERS IV SOLN: 500.0000 mL | INTRAVENOUS | Status: DC | PRN

## 2012-01-25 ENCOUNTER — Encounter (HOSPITAL_COMMUNITY): Payer: Self-pay | Admitting: Anesthesiology

## 2012-01-25 LAB — CBC
HCT: 37.4 % (ref 36.0–46.0)
MCH: 29.9 pg (ref 26.0–34.0)
MCV: 92.3 fL (ref 78.0–100.0)
RBC: 4.05 MIL/uL (ref 3.87–5.11)
RDW: 14.3 % (ref 11.5–15.5)
WBC: 11.8 10*3/uL — ABNORMAL HIGH (ref 4.0–10.5)

## 2012-01-25 MED ORDER — BUTORPHANOL TARTRATE 1 MG/ML IJ SOLN
1.0000 mg | Freq: Once | INTRAMUSCULAR | Status: AC
  Administered 2012-01-25: 1 mg via INTRAVENOUS
  Filled 2012-01-25: qty 1

## 2012-01-25 MED ORDER — ZOLPIDEM TARTRATE 5 MG PO TABS: 5.0000 mg | ORAL_TABLET | Freq: Every evening | ORAL | Status: DC | PRN

## 2012-01-25 MED ORDER — PHENYLEPHRINE 40 MCG/ML (10ML) SYRINGE FOR IV PUSH (FOR BLOOD PRESSURE SUPPORT): 80.0000 ug | PREFILLED_SYRINGE | INTRAVENOUS | Status: DC | PRN

## 2012-01-25 MED ORDER — OXYTOCIN 40 UNITS IN LACTATED RINGERS INFUSION - SIMPLE MED
1.0000 m[IU]/min | INTRAVENOUS | Status: DC
  Administered 2012-01-25: 2 m[IU]/min via INTRAVENOUS
  Filled 2012-01-25: qty 1000

## 2012-01-25 MED ORDER — FENTANYL 2.5 MCG/ML BUPIVACAINE 1/10 % EPIDURAL INFUSION (WH - ANES)
14.0000 mL/h | INTRAMUSCULAR | Status: DC
  Administered 2012-01-25 – 2012-01-26 (×2): 14 mL/h via EPIDURAL
  Filled 2012-01-25 (×2): qty 60

## 2012-01-25 MED ORDER — SODIUM CHLORIDE 0.9 % IV SOLN
1.0000 g | Freq: Four times a day (QID) | INTRAVENOUS | Status: DC
  Administered 2012-01-25 – 2012-01-26 (×4): 1 g via INTRAVENOUS
  Filled 2012-01-25 (×6): qty 1000

## 2012-01-25 MED ORDER — BUPIVACAINE HCL (PF) 0.25 % IJ SOLN
INTRAMUSCULAR | Status: DC | PRN
  Administered 2012-01-25 (×2): 5 mL

## 2012-01-25 MED ORDER — EPHEDRINE 5 MG/ML INJ: 10.0000 mg | INTRAVENOUS | Status: DC | PRN

## 2012-01-25 MED ORDER — LIDOCAINE HCL (PF) 1 % IJ SOLN
INTRAMUSCULAR | Status: DC | PRN
  Administered 2012-01-25 (×2): 5 mL

## 2012-01-25 MED ORDER — DIPHENHYDRAMINE HCL 50 MG/ML IJ SOLN: 12.5000 mg | INTRAMUSCULAR | Status: DC | PRN

## 2012-01-25 MED ORDER — GENTAMICIN SULFATE 40 MG/ML IJ SOLN
120.0000 mg | Freq: Three times a day (TID) | INTRAVENOUS | Status: DC
  Administered 2012-01-25 – 2012-01-26 (×3): 120 mg via INTRAVENOUS
  Filled 2012-01-25 (×4): qty 3

## 2012-01-25 MED ORDER — SODIUM CHLORIDE 0.9 % IV SOLN
INTRAVENOUS | Status: DC
  Administered 2012-01-25: 23:00:00 via EPIDURAL
  Filled 2012-01-25 (×2): qty 20

## 2012-01-25 MED ORDER — TERBUTALINE SULFATE 1 MG/ML IJ SOLN: 0.2500 mg | Freq: Once | INTRAMUSCULAR | Status: AC | PRN

## 2012-01-25 MED ORDER — PHENYLEPHRINE 40 MCG/ML (10ML) SYRINGE FOR IV PUSH (FOR BLOOD PRESSURE SUPPORT)
80.0000 ug | PREFILLED_SYRINGE | INTRAVENOUS | Status: DC | PRN
  Filled 2012-01-25 (×2): qty 5

## 2012-01-25 MED ORDER — LACTATED RINGERS IV SOLN
500.0000 mL | Freq: Once | INTRAVENOUS | Status: AC
  Administered 2012-01-25: 500 mL via INTRAVENOUS

## 2012-01-25 MED ORDER — EPHEDRINE 5 MG/ML INJ
10.0000 mg | INTRAVENOUS | Status: DC | PRN
  Filled 2012-01-25 (×2): qty 4

## 2012-01-25 NOTE — H&P (Signed)
Kim Sharp is a 26 y.o. female presenting for induction due to gest HTN + pt preference. Maternal Medical History:  Fetal activity: Perceived fetal activity is normal.      OB History    Grav Para Term Preterm Abortions TAB SAB Ect Mult Living   1              Past Medical History  Diagnosis Date   Atrial septal defect and mitral stenosis    VSD (ventricular septal defect)    Cystic fibrosis carrier    H/O varicella    Headache    Past Surgical History  Procedure Date   Cardiac surgery     asd and vsd repair   Family History: family history includes Heart disease in her paternal grandmother; Hypertension in her father; and Stroke in her father. Social History:  reports that she has never smoked. She has never used smokeless tobacco. She reports that she does not drink alcohol or use illicit drugs.   Prenatal Transfer Tool  Maternal Diabetes: No Genetic Screening: Normal Maternal Ultrasounds/Referrals: Normal Fetal Ultrasounds or other Referrals:  None Maternal Substance Abuse:  No Significant Maternal Medications:  None Significant Maternal Lab Results:  None Other Comments:  mother is CF +, father screened CF NEG  ROS  Dilation: Closed Effacement (%): Thick Station: -2 Exam by:: B.Cagna,RN Blood pressure 101/58, pulse 72, temperature 97.7 F (36.5 C), temperature source Oral, resp. rate 18, height 5\' 7"  (1.702 m), weight 255 lb (115.667 kg), last menstrual period 04/25/2011. Maternal Exam:  Abdomen: Patient reports no abdominal tenderness. Fundal height is term FH.   Estimated fetal weight is AGA.   Fetal presentation: vertex  Introitus: Normal vulva. Normal vagina.  Pelvis: adequate for delivery.   Cervix: Cervix evaluated by digital exam.     Physical Exam  Constitutional: She is oriented to person, place, and time. She appears well-developed and well-nourished.  HENT:  Head: Normocephalic and atraumatic.  Neck: Normal range of motion. Neck  supple.  Cardiovascular: Normal rate and regular rhythm.   Respiratory: Effort normal and breath sounds normal.  GI:       FHR 148, FH term  Genitourinary:       cx closed  Musculoskeletal: Normal range of motion.  Neurological: She is alert and oriented to person, place, and time.    Prenatal labs: ABO, Rh: --/--/B POS (09/19 2040) Antibody: NEG (09/19 2038) Rubella: Immune (02/14 0000) RPR: NON REACTIVE (09/19 2038)  HBsAg: Negative (02/14 0000)  HIV: Non-reactive (02/14 0000)  GBS: Positive (08/19 0000)   Assessment/Plan: 39 wk IUP, gest HTN, for induction, plan SBE prophy   Percy Comp M 01/25/2012, 8:14 AM

## 2012-01-25 NOTE — Anesthesia Preprocedure Evaluation (Addendum)
Anesthesia Evaluation  Patient identified by MRN, date of birth, ID band Patient awake    Reviewed: Allergy & Precautions, H&P , Patient's Chart, lab work & pertinent test results  Airway Mallampati: II TM Distance: >3 FB Neck ROM: full    Dental No notable dental hx.    Pulmonary neg pulmonary ROS,  breath sounds clear to auscultation  Pulmonary exam normal       Cardiovascular negative cardio ROS  Rhythm:regular Rate:Normal     Neuro/Psych  Headaches, negative neurological ROS  negative psych ROS   GI/Hepatic negative GI ROS, Neg liver ROS,   Endo/Other  negative endocrine ROSMorbid obesity  Renal/GU negative Renal ROS     Musculoskeletal   Abdominal   Peds  Hematology negative hematology ROS (+)   Anesthesia Other Findings Atrial septal defect and mitral stenosis     VSD (ventricular septal defect)        Cystic fibrosis carrier     H/O varicella        Headache    Reproductive/Obstetrics (+) Pregnancy                           Anesthesia Physical Anesthesia Plan  ASA: III  Anesthesia Plan: Epidural   Post-op Pain Management:    Induction:   Airway Management Planned:   Additional Equipment:   Intra-op Plan:   Post-operative Plan:   Informed Consent: I have reviewed the patients History and Physical, chart, labs and discussed the procedure including the risks, benefits and alternatives for the proposed anesthesia with the patient or authorized representative who has indicated his/her understanding and acceptance.     Plan Discussed with:   Anesthesia Plan Comments:         Anesthesia Quick Evaluation

## 2012-01-25 NOTE — Anesthesia Procedure Notes (Addendum)
Epidural Patient location during procedure: OB Start time: 01/25/2012 8:45 PM  Staffing Anesthesiologist: Brayton Caves R Performed by: anesthesiologist   Preanesthetic Checklist Completed: patient identified, site marked, surgical consent, pre-op evaluation, timeout performed, IV checked, risks and benefits discussed and monitors and equipment checked  Epidural Patient position: sitting Prep: site prepped and draped and DuraPrep Patient monitoring: continuous pulse ox and blood pressure Approach: midline Injection technique: LOR air and LOR saline  Needle:  Needle type: Tuohy  Needle gauge: 17 G Needle length: 9 cm and 9 Needle insertion depth: 8 cm Catheter type: closed end flexible Catheter size: 19 Gauge Catheter at skin depth: 14 cm Test dose: negative  Assessment Events: blood not aspirated, injection not painful, no injection resistance, negative IV test and no paresthesia  Additional Notes Patient identified.  Risk benefits discussed including failed block, incomplete pain control, headache, nerve damage, paralysis, blood pressure changes, nausea, vomiting, reactions to medication both toxic or allergic, and postpartum back pain.  Patient expressed understanding and wished to proceed.  All questions were answered.  Sterile technique used throughout procedure and epidural site dressed with sterile barrier dressing. No paresthesia or other complications noted.The patient did not experience any signs of intravascular injection such as tinnitus or metallic taste in mouth nor signs of intrathecal spread such as rapid motor block. Please see nursing notes for vital signs.   Epidural Patient location during procedure: OB Start time: 01/25/2012 11:23 PM  Staffing Anesthesiologist: Brayton Caves R Performed by: anesthesiologist   Preanesthetic Checklist Completed: patient identified, site marked, surgical consent, pre-op evaluation, timeout performed, IV checked, risks  and benefits discussed and monitors and equipment checked  Epidural Patient position: sitting Prep: site prepped and draped and DuraPrep Patient monitoring: continuous pulse ox and blood pressure Approach: midline Injection technique: LOR air and LOR saline  Needle:  Needle type: Tuohy  Needle gauge: 17 G Needle length: 9 cm and 9 Needle insertion depth: 9 cm Catheter type: closed end flexible Catheter size: 19 Gauge Catheter at skin depth: 15 cm Test dose: negative  Assessment Events: blood not aspirated, injection not painful, no injection resistance, negative IV test and no paresthesia  Additional Notes Patient identified.  Risk benefits discussed including failed block, incomplete pain control, headache, nerve damage, paralysis, blood pressure changes, nausea, vomiting, reactions to medication both toxic or allergic, and postpartum back pain.  Patient expressed understanding and wished to proceed.  All questions were answered.  Sterile technique used throughout procedure and epidural site dressed with sterile barrier dressing. No paresthesia or other complications noted.The patient did not experience any signs of intravascular injection such as tinnitus or metallic taste in mouth nor signs of intrathecal spread such as rapid motor block. Please see nursing notes for vital signs.

## 2012-01-25 NOTE — Progress Notes (Signed)
ANTIBIOTIC CONSULT NOTE - INITIAL  Pharmacy Consult for Gentamicin Indication: SBE Prophylaxis  Allergies  Allergen Reactions   Codeine Nausea Only    Causes nausea.   Penicillins Nausea Only    Causes nausea.    Patient Measurements: Height: 5\' 7"  (170.2 cm) Weight: 255 lb (115.667 kg) IBW/kg (Calculated) : 61.6   Vital Signs: Temp: 97.7 F (36.5 C) (09/20 0546) Temp src: Oral (09/20 0546) BP: 101/58 mmHg (09/20 0652) Pulse Rate: 72  (09/20 0652)  Labs:  Alvira Philips 01/24/12 2038  WBC 9.7  HGB 12.0  PLT 292  LABCREA --  CREATININE --  CRCLEARANCE --    Medications:  Pt is also on Ampicillin  Assessment: 26 y.o. female G1P0 at [redacted]w[redacted]d  Pt has a history of Atrial Septal Defect, Ventricular Septal Defect, and mitral valve stenosis.  Plan:  Gentamicin120 mg IV every 8 hrs for SBE prophylaxis  Natasha Bence 01/25/2012,10:21 AM

## 2012-01-26 ENCOUNTER — Encounter (HOSPITAL_COMMUNITY): Payer: Self-pay

## 2012-01-26 ENCOUNTER — Encounter (HOSPITAL_COMMUNITY): Payer: Self-pay | Admitting: Anesthesiology

## 2012-01-26 ENCOUNTER — Inpatient Hospital Stay (HOSPITAL_COMMUNITY): Payer: 59 | Admitting: Anesthesiology

## 2012-01-26 ENCOUNTER — Encounter (HOSPITAL_COMMUNITY)
Admission: RE | Disposition: A | Discharge status: Home / Self Care | Payer: Self-pay | Source: Ambulatory Visit | Attending: Obstetrics and Gynecology

## 2012-01-26 LAB — CBC
HCT: 31.5 % — ABNORMAL LOW (ref 36.0–46.0)
Hemoglobin: 10.5 g/dL — ABNORMAL LOW (ref 12.0–15.0)
MCH: 30.7 pg (ref 26.0–34.0)
MCV: 92.1 fL (ref 78.0–100.0)
Platelets: 221 10*3/uL (ref 150–400)
RBC: 3.42 MIL/uL — ABNORMAL LOW (ref 3.87–5.11)

## 2012-01-26 SURGERY — Surgical Case
Anesthesia: Epidural | Site: Abdomen | Wound class: Clean Contaminated

## 2012-01-26 MED ORDER — ONDANSETRON HCL 4 MG/2ML IJ SOLN
INTRAMUSCULAR | Status: AC
  Administered 2012-01-26: 4 mg via INTRAVENOUS
  Filled 2012-01-26: qty 2

## 2012-01-26 MED ORDER — FENTANYL CITRATE 0.05 MG/ML IJ SOLN: 25.0000 ug | INTRAMUSCULAR | Status: DC | PRN

## 2012-01-26 MED ORDER — SIMETHICONE 80 MG PO CHEW
80.0000 mg | CHEWABLE_TABLET | Freq: Three times a day (TID) | ORAL | Status: DC
  Administered 2012-01-26 – 2012-01-28 (×3): 80 mg via ORAL

## 2012-01-26 MED ORDER — MORPHINE SULFATE 0.5 MG/ML IJ SOLN
INTRAMUSCULAR | Status: AC
  Filled 2012-01-26: qty 10

## 2012-01-26 MED ORDER — IBUPROFEN 800 MG PO TABS
800.0000 mg | ORAL_TABLET | Freq: Three times a day (TID) | ORAL | Status: DC | PRN
  Administered 2012-01-27 – 2012-01-28 (×2): 800 mg via ORAL
  Filled 2012-01-26 (×2): qty 1

## 2012-01-26 MED ORDER — SIMETHICONE 80 MG PO CHEW: 80.0000 mg | CHEWABLE_TABLET | ORAL | Status: DC | PRN

## 2012-01-26 MED ORDER — ONDANSETRON HCL 4 MG/2ML IJ SOLN
4.0000 mg | INTRAMUSCULAR | Status: DC | PRN
  Administered 2012-01-26: 4 mg via INTRAVENOUS

## 2012-01-26 MED ORDER — ACETAMINOPHEN 10 MG/ML IV SOLN: 1000.0000 mg | Freq: Four times a day (QID) | INTRAVENOUS | Status: AC | PRN

## 2012-01-26 MED ORDER — DIPHENHYDRAMINE HCL 25 MG PO CAPS: 25.0000 mg | ORAL_CAPSULE | ORAL | Status: DC | PRN

## 2012-01-26 MED ORDER — MORPHINE SULFATE (PF) 0.5 MG/ML IJ SOLN
INTRAMUSCULAR | Status: DC | PRN
  Administered 2012-01-26: 1 mg via INTRAVENOUS
  Administered 2012-01-26: 4 mg via EPIDURAL

## 2012-01-26 MED ORDER — MEPERIDINE HCL 25 MG/ML IJ SOLN
INTRAMUSCULAR | Status: DC | PRN
  Administered 2012-01-26: 25 mg via INTRAVENOUS

## 2012-01-26 MED ORDER — OXYTOCIN 10 UNIT/ML IJ SOLN
INTRAMUSCULAR | Status: AC
  Filled 2012-01-26: qty 4

## 2012-01-26 MED ORDER — PROMETHAZINE HCL 25 MG/ML IJ SOLN: 6.2500 mg | INTRAMUSCULAR | Status: DC | PRN

## 2012-01-26 MED ORDER — MEPERIDINE HCL 25 MG/ML IJ SOLN: 6.2500 mg | INTRAMUSCULAR | Status: DC | PRN

## 2012-01-26 MED ORDER — DEXTROSE IN LACTATED RINGERS 5 % IV SOLN
INTRAVENOUS | Status: DC
  Administered 2012-01-26 – 2012-01-27 (×3): via INTRAVENOUS

## 2012-01-26 MED ORDER — ONDANSETRON HCL 4 MG/2ML IJ SOLN
INTRAMUSCULAR | Status: AC
  Filled 2012-01-26: qty 2

## 2012-01-26 MED ORDER — FLEET ENEMA 7-19 GM/118ML RE ENEM: 1.0000 | ENEMA | Freq: Every day | RECTAL | Status: DC | PRN

## 2012-01-26 MED ORDER — SENNOSIDES-DOCUSATE SODIUM 8.6-50 MG PO TABS
2.0000 | ORAL_TABLET | Freq: Every day | ORAL | Status: DC
  Administered 2012-01-26: 2 via ORAL

## 2012-01-26 MED ORDER — 0.9 % SODIUM CHLORIDE (POUR BTL) OPTIME
TOPICAL | Status: DC | PRN
  Administered 2012-01-26: 1000 mL

## 2012-01-26 MED ORDER — WITCH HAZEL-GLYCERIN EX PADS: 1.0000 "application " | MEDICATED_PAD | CUTANEOUS | Status: DC | PRN

## 2012-01-26 MED ORDER — ONDANSETRON HCL 4 MG/2ML IJ SOLN: 4.0000 mg | Freq: Three times a day (TID) | INTRAMUSCULAR | Status: DC | PRN

## 2012-01-26 MED ORDER — ONDANSETRON HCL 4 MG PO TABS: 4.0000 mg | ORAL_TABLET | ORAL | Status: DC | PRN

## 2012-01-26 MED ORDER — DIBUCAINE 1 % RE OINT: 1.0000 "application " | TOPICAL_OINTMENT | RECTAL | Status: DC | PRN

## 2012-01-26 MED ORDER — SODIUM BICARBONATE 8.4 % IV SOLN
INTRAVENOUS | Status: DC | PRN
  Administered 2012-01-26: 5 mL via EPIDURAL

## 2012-01-26 MED ORDER — MIDAZOLAM HCL 2 MG/2ML IJ SOLN: 0.5000 mg | Freq: Once | INTRAMUSCULAR | Status: DC | PRN

## 2012-01-26 MED ORDER — ZOLPIDEM TARTRATE 5 MG PO TABS: 5.0000 mg | ORAL_TABLET | Freq: Every evening | ORAL | Status: DC | PRN

## 2012-01-26 MED ORDER — NALBUPHINE HCL 10 MG/ML IJ SOLN
5.0000 mg | INTRAMUSCULAR | Status: DC | PRN
  Filled 2012-01-26 (×2): qty 1

## 2012-01-26 MED ORDER — MEPERIDINE HCL 25 MG/ML IJ SOLN
INTRAMUSCULAR | Status: AC
  Filled 2012-01-26: qty 1

## 2012-01-26 MED ORDER — PRENATAL MULTIVITAMIN CH
1.0000 | ORAL_TABLET | Freq: Every day | ORAL | Status: DC
  Administered 2012-01-27 – 2012-01-28 (×2): 1 via ORAL
  Filled 2012-01-26 (×2): qty 1

## 2012-01-26 MED ORDER — METOCLOPRAMIDE HCL 5 MG/ML IJ SOLN
INTRAMUSCULAR | Status: AC
  Administered 2012-01-26: 10 mg via INTRAVENOUS
  Filled 2012-01-26: qty 2

## 2012-01-26 MED ORDER — LACTATED RINGERS IV SOLN
INTRAVENOUS | Status: DC | PRN
  Administered 2012-01-26: 05:00:00 via INTRAVENOUS

## 2012-01-26 MED ORDER — DIPHENHYDRAMINE HCL 25 MG PO CAPS: 25.0000 mg | ORAL_CAPSULE | Freq: Four times a day (QID) | ORAL | Status: DC | PRN

## 2012-01-26 MED ORDER — LANOLIN HYDROUS EX OINT: 1.0000 "application " | TOPICAL_OINTMENT | CUTANEOUS | Status: DC | PRN

## 2012-01-26 MED ORDER — MEASLES, MUMPS & RUBELLA VAC ~~LOC~~ INJ: 0.5000 mL | INJECTION | Freq: Once | SUBCUTANEOUS | Status: DC

## 2012-01-26 MED ORDER — TETANUS-DIPHTH-ACELL PERTUSSIS 5-2.5-18.5 LF-MCG/0.5 IM SUSP: 0.5000 mL | Freq: Once | INTRAMUSCULAR | Status: DC

## 2012-01-26 MED ORDER — KETOROLAC TROMETHAMINE 30 MG/ML IJ SOLN
INTRAMUSCULAR | Status: AC
  Administered 2012-01-26: 30 mg via INTRAMUSCULAR
  Filled 2012-01-26: qty 1

## 2012-01-26 MED ORDER — LACTATED RINGERS IV SOLN
INTRAVENOUS | Status: DC | PRN
  Administered 2012-01-26 (×2): via INTRAVENOUS

## 2012-01-26 MED ORDER — DIPHENHYDRAMINE HCL 50 MG/ML IJ SOLN: 25.0000 mg | INTRAMUSCULAR | Status: DC | PRN

## 2012-01-26 MED ORDER — NALOXONE HCL 0.4 MG/ML IJ SOLN: 0.4000 mg | INTRAMUSCULAR | Status: DC | PRN

## 2012-01-26 MED ORDER — OXYCODONE-ACETAMINOPHEN 5-325 MG PO TABS: 1.0000 | ORAL_TABLET | Freq: Four times a day (QID) | ORAL | Status: DC | PRN

## 2012-01-26 MED ORDER — OXYTOCIN 10 UNIT/ML IJ SOLN
40.0000 [IU] | INTRAVENOUS | Status: DC | PRN
  Administered 2012-01-26: 40 [IU] via INTRAVENOUS

## 2012-01-26 MED ORDER — NALBUPHINE HCL 10 MG/ML IJ SOLN
5.0000 mg | INTRAMUSCULAR | Status: DC | PRN
Start: 2012-01-26 — End: 2012-01-28
  Filled 2012-01-26: qty 1

## 2012-01-26 MED ORDER — KETOROLAC TROMETHAMINE 30 MG/ML IJ SOLN
30.0000 mg | Freq: Four times a day (QID) | INTRAMUSCULAR | Status: AC | PRN
  Administered 2012-01-26: 30 mg via INTRAMUSCULAR

## 2012-01-26 MED ORDER — BISACODYL 10 MG RE SUPP: 10.0000 mg | Freq: Every day | RECTAL | Status: DC | PRN

## 2012-01-26 MED ORDER — SCOPOLAMINE 1 MG/3DAYS TD PT72
1.0000 | MEDICATED_PATCH | Freq: Once | TRANSDERMAL | Status: DC
  Administered 2012-01-26: 1.5 mg via TRANSDERMAL

## 2012-01-26 MED ORDER — SODIUM CHLORIDE 0.9 % IJ SOLN: 3.0000 mL | INTRAMUSCULAR | Status: DC | PRN

## 2012-01-26 MED ORDER — KETOROLAC TROMETHAMINE 30 MG/ML IJ SOLN: 30.0000 mg | Freq: Four times a day (QID) | INTRAMUSCULAR | Status: AC | PRN

## 2012-01-26 MED ORDER — MENTHOL 3 MG MT LOZG: 1.0000 | LOZENGE | OROMUCOSAL | Status: DC | PRN

## 2012-01-26 MED ORDER — SODIUM BICARBONATE 8.4 % IV SOLN
INTRAVENOUS | Status: AC
  Filled 2012-01-26: qty 50

## 2012-01-26 MED ORDER — DIPHENHYDRAMINE HCL 50 MG/ML IJ SOLN: 12.5000 mg | INTRAMUSCULAR | Status: DC | PRN

## 2012-01-26 MED ORDER — OXYTOCIN 40 UNITS IN LACTATED RINGERS INFUSION - SIMPLE MED: 62.5000 mL/h | INTRAVENOUS | Status: AC

## 2012-01-26 MED ORDER — SODIUM CHLORIDE 0.9 % IV SOLN: 1.0000 ug/kg/h | INTRAVENOUS | Status: DC | PRN

## 2012-01-26 MED ORDER — INFLUENZA VIRUS VACC SPLIT PF IM SUSP
0.5000 mL | INTRAMUSCULAR | Status: AC
  Administered 2012-01-27: 0.5 mL via INTRAMUSCULAR
  Filled 2012-01-26: qty 0.5

## 2012-01-26 MED ORDER — LIDOCAINE-EPINEPHRINE (PF) 2 %-1:200000 IJ SOLN
INTRAMUSCULAR | Status: AC
  Filled 2012-01-26: qty 20

## 2012-01-26 MED ORDER — SODIUM CHLORIDE 0.9 % IJ SOLN: 3.0000 mL | Freq: Two times a day (BID) | INTRAMUSCULAR | Status: DC

## 2012-01-26 MED ORDER — SCOPOLAMINE 1 MG/3DAYS TD PT72
MEDICATED_PATCH | TRANSDERMAL | Status: AC
  Filled 2012-01-26: qty 1

## 2012-01-26 MED ORDER — METOCLOPRAMIDE HCL 5 MG/ML IJ SOLN
10.0000 mg | Freq: Three times a day (TID) | INTRAMUSCULAR | Status: DC | PRN
  Administered 2012-01-26: 10 mg via INTRAVENOUS

## 2012-01-26 MED ORDER — ONDANSETRON HCL 4 MG/2ML IJ SOLN
INTRAMUSCULAR | Status: DC | PRN
  Administered 2012-01-26: 4 mg via INTRAVENOUS

## 2012-01-26 MED ORDER — SODIUM CHLORIDE 0.9 % IV SOLN: 250.0000 mL | INTRAVENOUS | Status: DC

## 2012-01-26 SURGICAL SUPPLY — 29 items
CLOTH BEACON ORANGE TIMEOUT ST (SAFETY) ×2 IMPLANT
DRAPE SURG 17X23 STRL (DRAPES) IMPLANT
DRESSING TELFA 8X3 (GAUZE/BANDAGES/DRESSINGS) IMPLANT
DRSG COVADERM 4X10 (GAUZE/BANDAGES/DRESSINGS) ×2 IMPLANT
DURAPREP 26ML APPLICATOR (WOUND CARE) IMPLANT
ELECT REM PT RETURN 9FT ADLT (ELECTROSURGICAL) ×2
ELECTRODE REM PT RTRN 9FT ADLT (ELECTROSURGICAL) ×1 IMPLANT
EXTRACTOR VACUUM M CUP 4 TUBE (SUCTIONS) IMPLANT
GAUZE SPONGE 4X4 12PLY STRL LF (GAUZE/BANDAGES/DRESSINGS) ×2 IMPLANT
GLOVE BIO SURGEON STRL SZ7 (GLOVE) ×4 IMPLANT
GOWN PREVENTION PLUS LG XLONG (DISPOSABLE) ×6 IMPLANT
KIT ABG SYR 3ML LUER SLIP (SYRINGE) ×2 IMPLANT
NEEDLE HYPO 25X5/8 SAFETYGLIDE (NEEDLE) ×2 IMPLANT
NS IRRIG 1000ML POUR BTL (IV SOLUTION) ×2 IMPLANT
PACK C SECTION WH (CUSTOM PROCEDURE TRAY) ×2 IMPLANT
PAD ABD 7.5X8 STRL (GAUZE/BANDAGES/DRESSINGS) IMPLANT
PAD OB MATERNITY 4.3X12.25 (PERSONAL CARE ITEMS) ×2 IMPLANT
PENCIL BUTTON HOLSTER BLD 10FT (ELECTRODE) ×2 IMPLANT
SLEEVE SCD COMPRESS KNEE MED (MISCELLANEOUS) ×2 IMPLANT
STRIP CLOSURE SKIN 1/2X4 (GAUZE/BANDAGES/DRESSINGS) IMPLANT
SUT CHROMIC 0 CTX 36 (SUTURE) ×6 IMPLANT
SUT MON AB 4-0 PS1 27 (SUTURE) ×2 IMPLANT
SUT PDS AB 0 CT1 27 (SUTURE) ×4 IMPLANT
SUT VIC AB 3-0 CT1 27 (SUTURE) ×2
SUT VIC AB 3-0 CT1 TAPERPNT 27 (SUTURE) ×2 IMPLANT
SUT VIC AB 3-0 CTX 36 (SUTURE) ×2 IMPLANT
TOWEL OR 17X24 6PK STRL BLUE (TOWEL DISPOSABLE) ×4 IMPLANT
TRAY FOLEY CATH 14FR (SET/KITS/TRAYS/PACK) IMPLANT
WATER STERILE IRR 1000ML POUR (IV SOLUTION) IMPLANT

## 2012-01-26 NOTE — Addendum Note (Signed)
Addendum  created 01/26/12 1300 by Orlie Pollen, CRNA   Modules edited:Notes Section

## 2012-01-26 NOTE — Progress Notes (Addendum)
Pt with 2nd epidural and working well. Dr. Jean Rosenthal ordered pt to receive the Fentanyl 2.5 mcg/ml dose to be given and to D/C the Fentanyl 5 mcg/ml dose. Unable to waste in the Pyxis; Amount wasted is 32 ml, pt received 28 ml. Waste was witnessed by Helane Gunther, RN (Emp. # [id number]) Medication was wasted in sink.

## 2012-01-26 NOTE — Preoperative (Signed)
Beta Blockers ° ° °Reason not to administer Beta Blockers:Not Applicable °

## 2012-01-26 NOTE — Anesthesia Postprocedure Evaluation (Signed)
°  Anesthesia Post-op Note  Patient: Kim Sharp  Procedure(s) Performed: Procedure(s) (LRB) with comments: CESAREAN SECTION (N/A) - Primary Cesarean Section Delivery Baby Boy @ [phone], Apgar 9/9   Patient Location: PACU and Mother/Baby  Anesthesia Type: Epidural  Level of Consciousness: awake, alert , oriented and patient cooperative  Airway and Oxygen Therapy: Patient Spontanous Breathing  Post-op Pain: none  Post-op Assessment: Post-op Vital signs reviewed and Patient's Cardiovascular Status Stable  Post-op Vital Signs: Reviewed and stable  Complications: No apparent anesthesia complications

## 2012-01-26 NOTE — Op Note (Signed)
Preoperative diagnosis: CPD  Postoperative diagnosis: Same  Procedure: Primary low transverse cesarean section  Surgeon: Marcelle Overlie  Anesthesia: Epidural  EBL: 700 cc  Complications: None  Procedure and findings:  The patient taken the operating room after an adequate level of epidural anesthesia was obtained with the patient in left tilt position the abdomen prepped and draped in usual fashion for cesarean section Foley catheter was arty positioned draining clear urine appropriate timeout for taken at that point she was already prepartum on ampicillin and gentamicin for SBE prophylaxis. Transverse Pfannenstiel incision made 2 finger restaurant symphysis carried down to the fascia which was incised and extended transversely. Rectus muscle divided in the midline peritoneum entered superiorly without incident and extended in a vertical fashion. The vesicouterine serosa was incised and the bladder was bluntly and sharply dissected below, bladder blade repositioned. Transverse incision made lower segment sent with blunt dissection clear fluid noted, the patient delivered of a healthy female Apgars 9 and 9 the infant was suctioned cord clamped and passed the pediatric team for further care. The placenta was then delivered manually intact, uterus exteriorized cavity wiped clean with laparotomy pack closure transversely of 0 chromic in a locked fashion followed by an imbricating layer of chromic. Bilateral tubes and ovaries were normal cord pH was sent and is pending. Prior to closure sponge denies precast reported as correct x2 peritoneum closed with a 2-0 Vicryl suture 2-0 Vicryl interrupted sutures used to reapproximate the rectus muscles in the midline a 0 PDS suture was then used from laterally to midline on either side to close the fascia subcutaneous tissue was hemostatic a 3-0 Vicryl running suture was then used to reapproximate the subcutaneous tissue 4-0 Monocryl subcuticular closure with sterile  dressing applied she tolerated this well went to recovery room in good condition.  Dictated with [facility]  Jordyan Hardiman M. Milana Obey.D.

## 2012-01-26 NOTE — Transfer of Care (Signed)
Immediate Anesthesia Transfer of Care Note  Patient: Kim Sharp  Procedure(s) Performed: Procedure(s) (LRB) with comments: CESAREAN SECTION (N/A) - Primary Cesarean Section Delivery Baby Boy @ [phone], Apgar 9/9   Patient Location: PACU  Anesthesia Type: Epidural  Level of Consciousness: awake, alert  and patient cooperative  Airway & Oxygen Therapy: Patient Spontanous Breathing  Post-op Assessment: Report given to PACU RN  Post vital signs: Reviewed  Complications: No apparent anesthesia complications

## 2012-01-26 NOTE — Progress Notes (Signed)
This note also relates to the following rows which could not be included: Pulse Rate - Cannot attach notes to unvalidated device data SpO2 - Cannot attach notes to unvalidated device data  Pt not able to push effectively due to being exhausted. She states she does not feel like she can push anymore and would like to discuss a C/S with Dr. Marcelle Overlie.

## 2012-01-26 NOTE — Anesthesia Postprocedure Evaluation (Signed)
Anesthesia Post Note  Patient: Kim Sharp  Procedure(s) Performed: Procedure(s) (LRB): CESAREAN SECTION (N/A)  Anesthesia type: Epidural  Patient location: PACU  Post pain: Pain level controlled  Post assessment: Post-op Vital signs reviewed  Last Vitals:  Filed Vitals:   01/26/12 0645  BP: 112/55  Pulse: 80  Temp:   Resp: 16    Post vital signs: Reviewed  Level of consciousness: awake  Complications: No apparent anesthesia complications

## 2012-01-26 NOTE — Progress Notes (Signed)
She had good first stage labor progress , with epidural fro 1 cm, now 2 hr second stage with no descent beyond +1 to +2, she says she can't push anymore, stable FHR  Imp CPD>>.discussed CS

## 2012-01-26 NOTE — OR Nursing (Signed)
Approximately 300 ml's blood expressed on uterine massage.   Uterus firm at umbilicus

## 2012-01-27 ENCOUNTER — Encounter (HOSPITAL_COMMUNITY): Payer: Self-pay | Admitting: Obstetrics and Gynecology

## 2012-01-27 LAB — CBC
Hemoglobin: 9.6 g/dL — ABNORMAL LOW (ref 12.0–15.0)
MCH: 30 pg (ref 26.0–34.0)
MCV: 92.5 fL (ref 78.0–100.0)
RBC: 3.2 MIL/uL — ABNORMAL LOW (ref 3.87–5.11)

## 2012-01-27 NOTE — Progress Notes (Signed)
Subjective: Postpartum Day 1: Cesarean Delivery Patient reports tolerating PO.    Objective: Vital signs in last 24 hours: Temp:  [97.3 F (36.3 C)-98.9 F (37.2 C)] 98.9 F (37.2 C) (09/22 0540) Pulse Rate:  [69-92] 85  (09/22 0540) Resp:  [16-20] 18  (09/22 0540) BP: (98-120)/(53-72) 98/62 mmHg (09/22 0540) SpO2:  [98 %-100 %] 99 % (09/22 0054) Weight:  [255 lb (115.667 kg)] 255 lb (115.667 kg) (09/21 0932)  Physical Exam:  General: alert Lochia: appropriate Uterine Fundus: firm Incision: healing well DVT Evaluation: No evidence of DVT seen on physical exam.   Basename 01/27/12 0625 01/26/12 0640  HGB 9.6* 10.5*  HCT 29.6* 31.5*    Assessment/Plan: Status post Cesarean section. Doing well postoperatively.  Continue current care.  Meriel Pica 01/27/2012, 8:58 AM

## 2012-01-28 MED ORDER — OXYCODONE-ACETAMINOPHEN 5-325 MG PO TABS: 1.0000 | ORAL_TABLET | Freq: Four times a day (QID) | ORAL | Status: DC | PRN

## 2012-01-28 MED ORDER — IBUPROFEN 800 MG PO TABS: 800.0000 mg | ORAL_TABLET | Freq: Three times a day (TID) | ORAL | Status: DC | PRN

## 2012-01-28 NOTE — Discharge Summary (Signed)
Obstetric Discharge Summary Reason for Admission: induction of labor Prenatal Procedures: ultrasound Intrapartum Procedures: cesarean: low cervical, transverse Postpartum Procedures: none Complications-Operative and Postpartum: none Hemoglobin  Date Value Range Status  01/27/2012 9.6* 12.0 - 15.0 g/dL Final     HCT  Date Value Range Status  01/27/2012 29.6* 36.0 - 46.0 % Final    Physical Exam:  General: alert and cooperative Lochia: appropriate Uterine Fundus: firm Incision: healing well DVT Evaluation: No evidence of DVT seen on physical exam. Negative Homan's sign.  Discharge Diagnoses: Term Pregnancy-delivered  Discharge Information: Date: 01/28/2012 Activity: pelvic rest Diet: routine Medications: PNV, Ibuprofen and Percocet Condition: stable Instructions: refer to practice specific booklet Discharge to: home   Newborn Data: Live born female  Birth Weight: 8 lb 0.6 oz (3645 g) APGAR: 9, 9  Home with mother.  Khoi Hamberger G 01/28/2012, 8:16 AM

## 2012-01-28 NOTE — Progress Notes (Signed)
Post discharge chart review completed.

## 2012-01-28 NOTE — Progress Notes (Cosign Needed)
Subjective: Postpartum Day 2: Cesarean Delivery Patient reports tolerating PO, + BM and no problems voiding.    Objective: Vital signs in last 24 hours: Temp:  [97.8 F (36.6 C)-99.3 F (37.4 C)] 97.8 F (36.6 C) (09/23 0512) Pulse Rate:  [60-92] 60  (09/23 0512) Resp:  [18-20] 20  (09/23 0512) BP: (117-144)/(72-83) 144/75 mmHg (09/23 0512)  Physical Exam:  General: alert and cooperative Lochia: appropriate Uterine Fundus: firm Incision: healing well DVT Evaluation: No evidence of DVT seen on physical exam. Negative Homan's sign.   Basename 01/27/12 0625 01/26/12 0640  HGB 9.6* 10.5*  HCT 29.6* 31.5*    Assessment/Plan: Status post Cesarean section. Doing well postoperatively.  Discharge home with standard precautions and return to clinic in 1 week.  Quill Grinder G 01/28/2012, 8:00 AM

## 2012-01-29 NOTE — Patient Instructions (Signed)
°

## 2012-01-29 NOTE — Consent Form (Signed)
°

## 2012-01-29 NOTE — Procedures (Signed)
°

## 2012-07-03 ENCOUNTER — Encounter (HOSPITAL_COMMUNITY): Payer: Self-pay | Admitting: *Deleted

## 2012-07-03 ENCOUNTER — Emergency Department (HOSPITAL_COMMUNITY)
Admission: EM | Admit: 2012-07-03 | Discharge: 2012-07-03 | Discharge status: Against Medical Advice | Payer: 59 | Attending: Emergency Medicine | Admitting: Emergency Medicine

## 2012-07-03 DIAGNOSIS — R110 Nausea: Secondary | ICD-10-CM | POA: Insufficient documentation

## 2012-07-03 DIAGNOSIS — R079 Chest pain, unspecified: Secondary | ICD-10-CM | POA: Insufficient documentation

## 2012-07-03 DIAGNOSIS — R197 Diarrhea, unspecified: Secondary | ICD-10-CM | POA: Insufficient documentation

## 2012-07-03 DIAGNOSIS — R109 Unspecified abdominal pain: Secondary | ICD-10-CM | POA: Insufficient documentation

## 2012-07-03 NOTE — ED Notes (Signed)
Per registration pt left because she has to work in the am.

## 2012-07-03 NOTE — ED Notes (Signed)
Pain in chest, abd, diarrhea,nausea, no vomiting,  Had fever earlier in week.

## 2012-10-27 ENCOUNTER — Emergency Department (HOSPITAL_COMMUNITY): Payer: PRIVATE HEALTH INSURANCE

## 2012-10-27 ENCOUNTER — Emergency Department (HOSPITAL_COMMUNITY)
Admission: EM | Admit: 2012-10-27 | Discharge: 2012-10-27 | Disposition: A | Discharge status: Home / Self Care | Payer: PRIVATE HEALTH INSURANCE | Attending: Emergency Medicine | Admitting: Emergency Medicine

## 2012-10-27 ENCOUNTER — Encounter (HOSPITAL_COMMUNITY): Payer: Self-pay | Admitting: *Deleted

## 2012-10-27 DIAGNOSIS — S93402A Sprain of unspecified ligament of left ankle, initial encounter: Secondary | ICD-10-CM

## 2012-10-27 DIAGNOSIS — Q211 Atrial septal defect: Secondary | ICD-10-CM | POA: Insufficient documentation

## 2012-10-27 DIAGNOSIS — Z8619 Personal history of other infectious and parasitic diseases: Secondary | ICD-10-CM | POA: Insufficient documentation

## 2012-10-27 DIAGNOSIS — Z8669 Personal history of other diseases of the nervous system and sense organs: Secondary | ICD-10-CM | POA: Insufficient documentation

## 2012-10-27 DIAGNOSIS — X500XXA Overexertion from strenuous movement or load, initial encounter: Secondary | ICD-10-CM | POA: Insufficient documentation

## 2012-10-27 DIAGNOSIS — Z141 Cystic fibrosis carrier: Secondary | ICD-10-CM | POA: Insufficient documentation

## 2012-10-27 DIAGNOSIS — S0990XA Unspecified injury of head, initial encounter: Secondary | ICD-10-CM | POA: Insufficient documentation

## 2012-10-27 DIAGNOSIS — Y9289 Other specified places as the place of occurrence of the external cause: Secondary | ICD-10-CM | POA: Insufficient documentation

## 2012-10-27 DIAGNOSIS — Y9389 Activity, other specified: Secondary | ICD-10-CM | POA: Insufficient documentation

## 2012-10-27 DIAGNOSIS — S93409A Sprain of unspecified ligament of unspecified ankle, initial encounter: Secondary | ICD-10-CM | POA: Insufficient documentation

## 2012-10-27 DIAGNOSIS — Q210 Ventricular septal defect: Secondary | ICD-10-CM | POA: Insufficient documentation

## 2012-10-27 DIAGNOSIS — Z880 Allergy status to penicillin: Secondary | ICD-10-CM | POA: Insufficient documentation

## 2012-10-27 DIAGNOSIS — Q2111 Secundum atrial septal defect: Secondary | ICD-10-CM | POA: Insufficient documentation

## 2012-10-27 MED ORDER — MELOXICAM 7.5 MG PO TABS: ORAL_TABLET | ORAL | Status: DC

## 2012-10-27 MED ORDER — HYDROCODONE-ACETAMINOPHEN 7.5-325 MG PO TABS: 1.0000 | ORAL_TABLET | ORAL | Status: DC | PRN

## 2012-10-27 NOTE — ED Notes (Addendum)
rn attempted to call employee health, message left. Contact number given to pt.  She will follow up.  Pt given d/c instructions, verbalized understanding of all, scripts given.

## 2012-10-27 NOTE — ED Provider Notes (Signed)
Medical screening examination/treatment/procedure(s) were performed by non-physician practitioner and as supervising physician I was immediately available for consultation/collaboration.  Gilda Crease, MD 10/27/12 [phone]

## 2012-10-27 NOTE — ED Provider Notes (Signed)
History    CSN: [id number] Arrival date & time 10/27/12  1434  First MD Initiated Contact with Patient 10/27/12 1547     Chief Complaint  Patient presents with   Ankle Pain   (Consider location/radiation/quality/duration/timing/severity/associated sxs/prior Treatment) Patient is a 27 y.o. female presenting with ankle pain. The history is provided by the patient.  Ankle Pain Location:  Ankle Time since incident:  1 hour Injury: yes   Mechanism of injury comment:  Twisted left ankle  Ankle location:  L ankle Pain details:    Quality:  Aching and throbbing   Radiates to:  Does not radiate   Severity:  Severe   Onset quality:  Sudden   Duration:  1 hour   Timing:  Constant   Progression:  Worsening Chronicity:  New Dislocation: no   Foreign body present:  No foreign bodies Prior injury to area:  No Relieved by:  Nothing Worsened by:  Nothing tried Ineffective treatments:  Ice Associated symptoms: decreased ROM   Associated symptoms: no back pain, no neck pain and no tingling    Past Medical History  Diagnosis Date   Atrial septal defect and mitral stenosis    VSD (ventricular septal defect)    Cystic fibrosis carrier    H/O varicella    Headache(784.0)    Past Surgical History  Procedure Laterality Date   Cesarean section  01/26/2012    Procedure: CESAREAN SECTION;  Surgeon: Meriel Pica, MD;  Location: [facility];  Service: Obstetrics;  Laterality: N/A;  Primary Cesarean Section Delivery Baby Boy @ [phone], Apgar 9/9    Cardiac surgery      asd and vsd repair   Family History  Problem Relation Age of Onset   Hypertension Father    Stroke Father    Heart disease Paternal Grandmother    History  Substance Use Topics   Smoking status: Never Smoker    Smokeless tobacco: Never Used   Alcohol Use: No   OB History   Grav Para Term Preterm Abortions TAB SAB Ect Mult Living   1 1 1       1      Review of Systems  Constitutional: Negative for  activity change.       All ROS Neg except as noted in HPI  HENT: Negative for nosebleeds and neck pain.   Eyes: Negative for photophobia and discharge.  Respiratory: Negative for cough, shortness of breath and wheezing.   Cardiovascular: Negative for chest pain and palpitations.  Gastrointestinal: Negative for abdominal pain and blood in stool.  Genitourinary: Negative for dysuria, frequency and hematuria.  Musculoskeletal: Negative for back pain and arthralgias.  Skin: Negative.   Neurological: Positive for headaches. Negative for dizziness, seizures and speech difficulty.  Psychiatric/Behavioral: Negative for hallucinations and confusion.    Allergies  Codeine; Penicillins; and Stadol  Home Medications  No current outpatient prescriptions on file. BP 145/73   Pulse 78   Temp(Src) 98.4 F (36.9 C) (Oral)   Resp 17   Ht 5\' 6"  (1.676 m)   Wt 245 lb (111.131 kg)   BMI 39.56 kg/m2   SpO2 100%   LMP 10/20/2012   Breastfeeding? No Physical Exam  Nursing note and vitals reviewed. Constitutional: She is oriented to person, place, and time. She appears well-developed and well-nourished.  Non-toxic appearance.  HENT:  Head: Normocephalic.  Right Ear: Tympanic membrane and external ear normal.  Left Ear: Tympanic membrane and external ear normal.  Eyes: EOM and lids  are normal. Pupils are equal, round, and reactive to light.  Neck: Normal range of motion. Neck supple. Carotid bruit is not present.  Cardiovascular: Normal rate, regular rhythm, normal heart sounds, intact distal pulses and normal pulses.   Pulmonary/Chest: Breath sounds normal. No respiratory distress.  Abdominal: Soft. Bowel sounds are normal. There is no tenderness. There is no guarding.  Musculoskeletal: Normal range of motion.  There is mild-to-moderate swelling of the left lateral malleolus. The Achilles tendon is intact. The capillary refill is less than 3 seconds. The dorsalis pedis and posterior tibial pulses are 2+.  There is full range of motion of the left knee and hip.  Lymphadenopathy:       Head (right side): No submandibular adenopathy present.       Head (left side): No submandibular adenopathy present.    She has no cervical adenopathy.  Neurological: She is alert and oriented to person, place, and time. She has normal strength. No cranial nerve deficit or sensory deficit.  Skin: Skin is warm and dry.  Psychiatric: She has a normal mood and affect. Her speech is normal.    ED Course  Procedures (including critical care time) Labs Reviewed - No data to display Dg Ankle Complete Left  10/27/2012   *RADIOLOGY REPORT*  Clinical Data: Left ankle pain.  LEFT ANKLE COMPLETE - 3+ VIEW  Comparison: None  Findings: The ankle mortise is maintained.  No acute ankle fracture or osteochondral lesion.  The mid and hind foot bony structures are intact.  A small calcaneal heel spur is noted.  IMPRESSION: No acute fracture.   Original Report Authenticated By: Rudie Meyer, M.D.   Dg Foot Complete Left  10/27/2012   *RADIOLOGY REPORT*  Clinical Data: Injured ankle.  LEFT FOOT - COMPLETE 3+ VIEW  Comparison: None  Findings: The joint spaces are maintained.  No acute fracture.  A small calcaneal heel spur is noted.  Mild pes cavus deformity.  Os trigonum is noted.  IMPRESSION: No acute fracture.   Original Report Authenticated By: Rudie Meyer, M.D.   No diagnosis found.  MDM  Patient was at work, and while coming out of one of the department she twisted the left ankle. X-ray of the left foot is negative for fracture or dislocation. X-ray of the left ankle is negative for fracture or dislocation.  The vital signs are stable.  Plan at this time is for the patient to be fitted with an ankle stirrup splint, apply ice, use crutches, and prescription for Mobic and Norco given to the patient. Patient is to see Dr. Hilda Lias if not improving.  Kathie Dike, PA-C 10/27/12 1605

## 2012-10-27 NOTE — ED Notes (Signed)
Twisted lt ankle here at [facility] while at work.

## 2012-10-28 NOTE — Discharge Instr - Other Info (Signed)
°

## 2012-10-28 NOTE — Consent Form (Signed)
°

## 2013-11-11 ENCOUNTER — Other Ambulatory Visit: Payer: Self-pay

## 2013-11-26 ENCOUNTER — Other Ambulatory Visit (HOSPITAL_COMMUNITY): Payer: Self-pay | Admitting: Obstetrics and Gynecology

## 2013-11-26 DIAGNOSIS — Z8774 Personal history of (corrected) congenital malformations of heart and circulatory system: Secondary | ICD-10-CM

## 2013-11-26 DIAGNOSIS — Z8679 Personal history of other diseases of the circulatory system: Secondary | ICD-10-CM

## 2013-11-30 NOTE — Procedures (Signed)
°

## 2013-12-18 ENCOUNTER — Encounter (HOSPITAL_COMMUNITY): Payer: Self-pay

## 2013-12-18 ENCOUNTER — Ambulatory Visit (HOSPITAL_COMMUNITY)
Admission: RE | Admit: 2013-12-18 | Discharge: 2013-12-18 | Disposition: A | Discharge status: Home / Self Care | Payer: 59 | Source: Ambulatory Visit | Attending: Obstetrics and Gynecology | Admitting: Obstetrics and Gynecology

## 2013-12-18 VITALS — BP 145/71 | HR 87 | Wt 287.0 lb

## 2013-12-18 DIAGNOSIS — Z8679 Personal history of other diseases of the circulatory system: Secondary | ICD-10-CM | POA: Insufficient documentation

## 2013-12-18 DIAGNOSIS — O352XX1 Maternal care for (suspected) hereditary disease in fetus, fetus 1: Secondary | ICD-10-CM

## 2013-12-18 DIAGNOSIS — Z8774 Personal history of (corrected) congenital malformations of heart and circulatory system: Secondary | ICD-10-CM

## 2013-12-18 DIAGNOSIS — Z3689 Encounter for other specified antenatal screening: Secondary | ICD-10-CM | POA: Insufficient documentation

## 2013-12-24 ENCOUNTER — Ambulatory Visit (HOSPITAL_COMMUNITY): Payer: 59

## 2013-12-29 ENCOUNTER — Ambulatory Visit (HOSPITAL_COMMUNITY): Payer: 59

## 2013-12-31 ENCOUNTER — Other Ambulatory Visit (HOSPITAL_COMMUNITY): Payer: Self-pay

## 2014-01-05 ENCOUNTER — Telehealth: Payer: Self-pay | Admitting: Cardiology

## 2014-01-05 ENCOUNTER — Ambulatory Visit (HOSPITAL_COMMUNITY)
Admission: RE | Admit: 2014-01-05 | Discharge: 2014-01-05 | Disposition: A | Discharge status: Home / Self Care | Payer: 59 | Source: Ambulatory Visit | Attending: Obstetrics and Gynecology | Admitting: Obstetrics and Gynecology

## 2014-01-05 DIAGNOSIS — I059 Rheumatic mitral valve disease, unspecified: Secondary | ICD-10-CM | POA: Diagnosis not present

## 2014-01-05 DIAGNOSIS — O99419 Diseases of the circulatory system complicating pregnancy, unspecified trimester: Secondary | ICD-10-CM | POA: Diagnosis not present

## 2014-01-05 DIAGNOSIS — Q289 Congenital malformation of circulatory system, unspecified: Principal | ICD-10-CM

## 2014-01-05 DIAGNOSIS — Q211 Atrial septal defect: Secondary | ICD-10-CM | POA: Insufficient documentation

## 2014-01-05 DIAGNOSIS — I379 Nonrheumatic pulmonary valve disorder, unspecified: Secondary | ICD-10-CM

## 2014-01-05 DIAGNOSIS — Q2111 Secundum atrial septal defect: Secondary | ICD-10-CM | POA: Insufficient documentation

## 2014-01-05 NOTE — Progress Notes (Signed)
°  Echocardiogram 2D Echocardiogram has been performed.  Marjorie Deprey 01/05/2014, 11:41 AM

## 2014-01-15 ENCOUNTER — Ambulatory Visit (HOSPITAL_COMMUNITY)
Admission: RE | Admit: 2014-01-15 | Discharge: 2014-01-15 | Disposition: A | Discharge status: Home / Self Care | Payer: 59 | Source: Ambulatory Visit | Attending: Obstetrics and Gynecology | Admitting: Obstetrics and Gynecology

## 2014-01-15 VITALS — BP 129/64 | HR 87 | Wt 287.0 lb

## 2014-01-15 DIAGNOSIS — O99212 Obesity complicating pregnancy, second trimester: Secondary | ICD-10-CM

## 2014-01-15 DIAGNOSIS — O99210 Obesity complicating pregnancy, unspecified trimester: Secondary | ICD-10-CM

## 2014-01-15 DIAGNOSIS — E669 Obesity, unspecified: Secondary | ICD-10-CM | POA: Insufficient documentation

## 2014-01-15 DIAGNOSIS — O358XX0 Maternal care for other (suspected) fetal abnormality and damage, not applicable or unspecified: Secondary | ICD-10-CM

## 2014-01-15 DIAGNOSIS — Z8679 Personal history of other diseases of the circulatory system: Secondary | ICD-10-CM | POA: Diagnosis not present

## 2014-01-15 DIAGNOSIS — Z8774 Personal history of (corrected) congenital malformations of heart and circulatory system: Secondary | ICD-10-CM

## 2014-03-08 ENCOUNTER — Encounter (HOSPITAL_COMMUNITY): Payer: Self-pay

## 2014-03-29 ENCOUNTER — Inpatient Hospital Stay (HOSPITAL_COMMUNITY)
Admission: AD | Admit: 2014-03-29 | Discharge: 2014-03-29 | Disposition: A | Discharge status: Home / Self Care | Payer: 59 | Source: Ambulatory Visit | Attending: Obstetrics & Gynecology | Admitting: Obstetrics & Gynecology

## 2014-03-29 ENCOUNTER — Encounter (HOSPITAL_COMMUNITY): Payer: Self-pay | Admitting: *Deleted

## 2014-03-29 DIAGNOSIS — Z3A32 32 weeks gestation of pregnancy: Secondary | ICD-10-CM | POA: Insufficient documentation

## 2014-03-29 DIAGNOSIS — O133 Gestational [pregnancy-induced] hypertension without significant proteinuria, third trimester: Secondary | ICD-10-CM

## 2014-03-29 HISTORY — DX: Obesity, unspecified: E66.9

## 2014-03-29 HISTORY — DX: Gestational (pregnancy-induced) hypertension without significant proteinuria, unspecified trimester: O13.9

## 2014-03-29 LAB — URINALYSIS, ROUTINE W REFLEX MICROSCOPIC
Bilirubin Urine: NEGATIVE
GLUCOSE, UA: NEGATIVE mg/dL
HGB URINE DIPSTICK: NEGATIVE
KETONES UR: 15 mg/dL — AB
LEUKOCYTES UA: NEGATIVE
Nitrite: NEGATIVE
PROTEIN: NEGATIVE mg/dL
Specific Gravity, Urine: 1.025 (ref 1.005–1.030)
UROBILINOGEN UA: 0.2 mg/dL (ref 0.0–1.0)
pH: 6.5 (ref 5.0–8.0)

## 2014-03-29 LAB — COMPREHENSIVE METABOLIC PANEL
ALBUMIN: 2.6 g/dL — AB (ref 3.5–5.2)
ALT: 20 U/L (ref 0–35)
ANION GAP: 15 (ref 5–15)
AST: 27 U/L (ref 0–37)
Alkaline Phosphatase: 88 U/L (ref 39–117)
BUN: 10 mg/dL (ref 6–23)
CO2: 20 mEq/L (ref 19–32)
CREATININE: 0.56 mg/dL (ref 0.50–1.10)
Calcium: 9.4 mg/dL (ref 8.4–10.5)
Chloride: 99 mEq/L (ref 96–112)
GFR calc Af Amer: >90 mL/min (ref >90)
GFR calc non Af Amer: >90 mL/min (ref >90)
Glucose, Bld: 81 mg/dL (ref 70–99)
Potassium: 4.4 mEq/L (ref 3.7–5.3)
Sodium: 134 mEq/L — ABNORMAL LOW (ref 137–147)
TOTAL PROTEIN: 6.4 g/dL (ref 6.0–8.3)
Total Bilirubin: <0.2 mg/dL — ABNORMAL LOW (ref 0.3–1.2)

## 2014-03-29 LAB — CBC
HEMATOCRIT: 40 % (ref 36.0–46.0)
Hemoglobin: 13.3 g/dL (ref 12.0–15.0)
MCH: 30.5 pg (ref 26.0–34.0)
MCHC: 33.3 g/dL (ref 30.0–36.0)
MCV: 91.7 fL (ref 78.0–100.0)
Platelets: 270 10*3/uL (ref 150–400)
RBC: 4.36 MIL/uL (ref 3.87–5.11)
RDW: 14.2 % (ref 11.5–15.5)
WBC: 10 10*3/uL (ref 4.0–10.5)

## 2014-03-29 LAB — PROTEIN / CREATININE RATIO, URINE
CREATININE, URINE: 155.46 mg/dL
Protein Creatinine Ratio: 0.23 — ABNORMAL HIGH (ref 0.00–0.15)
TOTAL PROTEIN, URINE: 35.3 mg/dL

## 2014-03-29 LAB — LACTATE DEHYDROGENASE: LDH: 219 U/L (ref 94–250)

## 2014-03-29 LAB — URIC ACID: URIC ACID, SERUM: 4.7 mg/dL (ref 2.4–7.0)

## 2014-03-29 NOTE — MAU Note (Signed)
Assumed care of the patient.

## 2014-03-29 NOTE — MAU Provider Note (Signed)
History     CSN: [id number]  Arrival date and time: 03/29/14 1641   First Provider Initiated Contact with Patient 03/29/14 1802      No chief complaint on file.  HPI  [name] is a 28 y.o. female G2P1001 at 21w4dwho presents with elevated BP readings She was sent from the office for high blood pressure. She has been followed twice weekly for NST's and BP monitoring in the office. Her BP has been elevated throughout most of her pregnancy.   OB History    Gravida Para Term Preterm AB TAB SAB Ectopic Multiple Living   2 1 1       1       Past Medical History  Diagnosis Date   Atrial septal defect and mitral stenosis    VSD (ventricular septal defect)    Cystic fibrosis carrier    H/O varicella    Headache(784.0)    Obesity     Past Surgical History  Procedure Laterality Date   Cesarean section  01/26/2012    Procedure: CESAREAN SECTION;  Surgeon: RMargarette Asal MD;  [facility];  Service: Obstetrics;  Laterality: N/A;  Primary Cesarean Section Delivery Baby Boy @ [phone] Apgar 9/9    Cardiac surgery      asd and vsd repair    Family History  Problem Relation Age of Onset   Hypertension Father    Stroke Father    Heart disease Paternal Grandmother     History  Substance Use Topics   Smoking status: Never Smoker    Smokeless tobacco: Never Used   Alcohol Use: No    Allergies:  Allergies  Allergen Reactions   Codeine Nausea Only    Causes nausea.   Penicillins Nausea Only    Causes nausea.   Stadol [Butorphanol] Other (See Comments)    Caused hallucinations.    Prescriptions prior to admission  Medication Sig Dispense Refill Last Dose   acetaminophen (TYLENOL) 500 MG tablet Take 500-1,000 mg by mouth every 6 (six) hours as needed for mild pain.   03/29/2014 at Unknown time   calcium carbonate (TUMS EX) 750 MG chewable tablet Chew 1 tablet by mouth 3 (three) times daily as needed for heartburn.   Past Week at Unknown  time   famotidine (PEPCID) 20 MG tablet Take 20 mg by mouth daily.   03/29/2014 at Unknown time   Prenatal Vit-Fe Fumarate-FA (PRENATAL MULTIVITAMIN) TABS tablet Take 1 tablet by mouth at bedtime.   03/28/2014 at Unknown time   HYDROcodone-acetaminophen (NORCO) 7.5-325 MG per tablet Take 1 tablet by mouth every 4 (four) hours as needed for pain. (Patient not taking: Reported on 03/29/2014) 20 tablet 0 Not Taking   meloxicam (MOBIC) 7.5 MG tablet 1 po bid with food (Patient not taking: Reported on 03/29/2014) 12 tablet 0 Not Taking   Results for orders placed or performed during the hospital encounter of 03/29/14 (from the past 48 hour(s))  Urinalysis, Routine w reflex microscopic     Status: Abnormal   Collection Time: 03/29/14  4:56 PM  Result Value Ref Range   Color, Urine YELLOW YELLOW   APPearance CLEAR CLEAR   Specific Gravity, Urine 1.025 1.005 - 1.030   pH 6.5 5.0 - 8.0   Glucose, UA NEGATIVE NEGATIVE mg/dL   Hgb urine dipstick NEGATIVE NEGATIVE   Bilirubin Urine NEGATIVE NEGATIVE   Ketones, ur 15 (A) NEGATIVE mg/dL   Protein, ur NEGATIVE NEGATIVE mg/dL   Urobilinogen, UA  0.2 0.0 - 1.0 mg/dL   Nitrite NEGATIVE NEGATIVE   Leukocytes, UA NEGATIVE NEGATIVE    Comment: MICROSCOPIC NOT DONE ON URINES WITH NEGATIVE PROTEIN, BLOOD, LEUKOCYTES, NITRITE, OR GLUCOSE <1000 mg/dL.  Protein / creatinine ratio, urine     Status: Abnormal   Collection Time: 03/29/14  4:56 PM  Result Value Ref Range   Creatinine, Urine 155.46 mg/dL   Total Protein, Urine 35.3 mg/dL    Comment: NO NORMAL RANGE ESTABLISHED FOR THIS TEST   Protein Creatinine Ratio 0.23 (H) 0.00 - 0.15  CBC     Status: None   Collection Time: 03/29/14  6:00 PM  Result Value Ref Range   WBC 10.0 4.0 - 10.5 K/uL   RBC 4.36 3.87 - 5.11 MIL/uL   Hemoglobin 13.3 12.0 - 15.0 g/dL   HCT 40.0 36.0 - 46.0 %   MCV 91.7 78.0 - 100.0 fL   MCH 30.5 26.0 - 34.0 pg   MCHC 33.3 30.0 - 36.0 g/dL   RDW 14.2 11.5 - 15.5 %   Platelets  270 150 - 400 K/uL  Comprehensive metabolic panel     Status: Abnormal   Collection Time: 03/29/14  6:00 PM  Result Value Ref Range   Sodium 134 (L) 137 - 147 mEq/L   Potassium 4.4 3.7 - 5.3 mEq/L   Chloride 99 96 - 112 mEq/L   CO2 20 19 - 32 mEq/L   Glucose, Bld 81 70 - 99 mg/dL   BUN 10 6 - 23 mg/dL   Creatinine, Ser 0.56 0.50 - 1.10 mg/dL   Calcium 9.4 8.4 - 10.5 mg/dL   Total Protein 6.4 6.0 - 8.3 g/dL   Albumin 2.6 (L) 3.5 - 5.2 g/dL   AST 27 0 - 37 U/L   ALT 20 0 - 35 U/L   Alkaline Phosphatase 88 39 - 117 U/L   Total Bilirubin <0.2 (L) 0.3 - 1.2 mg/dL   GFR calc non Af Amer >90 >90 mL/min   GFR calc Af Amer >90 >90 mL/min    Comment: (NOTE) The eGFR has been calculated using the CKD EPI equation. This calculation has not been validated in all clinical situations. eGFR's persistently <90 mL/min signify possible Chronic Kidney Disease.    Anion gap 15 5 - 15  Uric acid     Status: None   Collection Time: 03/29/14  6:00 PM  Result Value Ref Range   Uric Acid, Serum 4.7 2.4 - 7.0 mg/dL  Lactate dehydrogenase     Status: None   Collection Time: 03/29/14  6:00 PM  Result Value Ref Range   LDH 219 94 - 250 U/L    Review of Systems  Eyes: Negative for blurred vision.  Cardiovascular: Positive for leg swelling. Negative for chest pain.  Gastrointestinal: Negative for abdominal pain.  Neurological: Negative for headaches.   Physical Exam   Blood pressure 140/84, pulse 81, temperature 98.1 F (36.7 C), temperature source Oral, resp. rate 16, height 5' 7"  (1.702 m), weight 131.09 kg (289 lb), last menstrual period 08/13/2013.  Physical Exam  Constitutional: She is oriented to person, place, and time. She appears well-developed and well-nourished. No distress.  HENT:  Head: Normocephalic.  Cardiovascular: Normal rate and normal heart sounds.   Respiratory: Effort normal and breath sounds normal. No respiratory distress.  GI: Soft. She exhibits no distension. There is  no tenderness.  Musculoskeletal: Normal range of motion.       Right ankle: She exhibits no swelling.  Left ankle: She exhibits no swelling.  Neurological: She is alert and oriented to person, place, and time. She has normal reflexes.  Skin: Skin is warm. She is not diaphoretic.  Psychiatric: Her behavior is normal.   Fetal Tracing: Baseline: 125 bpm  Variability: Moderate  Accelerations: 15x15 Decelerations: None Toco: 1 contraction noted    MAU Course  Procedures  none  MDM Protein creatine ration slightly elevated at 0.23; Negative protein on UA. Consulted with Dr. Lynnette Caffey. Discussed NST, labs and BP readings.   Assessment and Plan   A: 1. Pregnancy induced hypertension, third trimester    P: Discharge home in stable condition Preeclampsia precautions  Return to MAU if symptoms worsen Follow up with OB as scheduled Kick counts   Darrelyn Hillock Ivie Maese, NP 03/29/2014 7:09 PM

## 2014-03-29 NOTE — MAU Note (Signed)
Pt states here for eval of bp. Was 157/92 at office today. No headache dizziness blurred vision or visual changes. Denies bleeding or abnormal discharge.

## 2014-03-29 NOTE — Discharge Instructions (Signed)
°Hypertension During Pregnancy °Hypertension is also called high blood pressure. Blood pressure moves blood in your body. Sometimes, the force that moves the blood becomes too strong. When you are pregnant, this condition should be watched carefully. It can cause problems for you and your baby. °HOME CARE  °· Make and keep all of your doctor visits. °· Take medicine as told by your doctor. Tell your doctor about all medicines you take. °· Eat very little salt. °· Exercise regularly. °· Do not drink alcohol. °· Do not smoke. °· Do not have drinks with caffeine. °· Lie on your left side when resting. °· Your health care provider may ask you to take one low-dose aspirin (81mg) each day. °GET HELP RIGHT AWAY IF: °· You have bad belly (abdominal) pain. °· You have sudden puffiness (swelling) in the hands, ankles, or face. °· You gain 4 pounds (1.8 kilograms) or more in 1 week. °· You throw up (vomit) repeatedly. °· You have bleeding from the vagina. °· You do not feel the baby moving as much. °· You have a headache. °· You have blurred or double vision. °· You have muscle twitching or spasms. °· You have shortness of breath. °· You have blue fingernails and lips. °· You have blood in your pee (urine). °MAKE SURE YOU: °· Understand these instructions. °· Will watch your condition. °· Will get help right away if you are not doing well or get worse. °Document Released: 05/26/2010 Document Revised: 09/07/2013 Document Reviewed: 11/20/2012 °ExitCare® Patient Information ©2015 [employer]. This information is not intended to replace advice given to you by your health care provider. Make sure you discuss any questions you have with your health care provider. ° ° °Preeclampsia and Eclampsia °Preeclampsia is a serious condition that develops only during pregnancy. It is also called toxemia of pregnancy. This condition causes high blood pressure along with other symptoms, such as swelling and headaches. These may develop as the  condition gets worse. Preeclampsia may occur 20 weeks or later into your pregnancy.  °Diagnosing and treating preeclampsia early is very important. If not treated early, it can cause serious problems for you and your baby. One problem it can lead to is eclampsia, which is a condition that causes muscle jerking or shaking (convulsions) in the mother. Delivering your baby is the best treatment for preeclampsia or eclampsia.  °RISK FACTORS °The cause of preeclampsia is not known. You may be more likely to develop preeclampsia if you have certain risk factors. These include:  °· Being pregnant for the first time. °· Having preeclampsia in a past pregnancy. °· Having a family history of preeclampsia. °· Having high blood pressure. °· Being pregnant with twins or triplets. °· Being 35 or older. °· Being African American. °· Having kidney disease or diabetes. °· Having medical conditions such as lupus or blood diseases. °· Being very overweight (obese). °SIGNS AND SYMPTOMS  °The earliest signs of preeclampsia are: °· High blood pressure. °· Increased protein in your urine. Your health care provider will check for this at every prenatal visit. °Other symptoms that can develop include:  °· Severe headaches. °· Sudden weight gain. °· Swelling of your hands, face, legs, and feet. °· Feeling sick to your stomach (nauseous) and throwing up (vomiting). °· Vision problems (blurred or double vision). °· Numbness in your face, arms, legs, and feet. °· Dizziness. °· Slurred speech. °· Sensitivity to bright lights. °· Abdominal pain. °DIAGNOSIS  °There are no screening tests for preeclampsia. Your health   care provider will ask you about symptoms and check for signs of preeclampsia during your prenatal visits. You may also have tests, including: °· Urine testing. °· Blood testing. °· Checking your baby's heart rate. °· Checking the health of your baby and your placenta using images created with sound waves (ultrasound). °TREATMENT    °You can work out the best treatment approach together with your health care provider. It is very important to keep all prenatal appointments. If you have an increased risk of preeclampsia, you may need more frequent prenatal exams. °· Your health care provider may prescribe bed rest. °· You may have to eat as little salt as possible. °· You may need to take medicine to lower your blood pressure if the condition does not respond to more conservative measures. °· You may need to stay in the hospital if your condition is severe. There, treatment will focus on controlling your blood pressure and fluid retention. You may also need to take medicine to prevent seizures. °· If the condition gets worse, your baby may need to be delivered early to protect you and the baby. You may have your labor started with medicine (be induced), or you may have a cesarean delivery. °· Preeclampsia usually goes away after the baby is born. °HOME CARE INSTRUCTIONS  °· Only take over-the-counter or prescription medicines as directed by your health care provider. °· Lie on your left side while resting. This keeps pressure off your baby. °· Elevate your feet while resting. °· Get regular exercise. Ask your health care provider what type of exercise is safe for you. °· Avoid caffeine and alcohol. °· Do not smoke. °· Drink 6-8 glasses of water every day. °· Eat a balanced diet that is low in salt. Do not add salt to your food. °· Avoid stressful situations as much as possible. °· Get plenty of rest and sleep. °· Keep all prenatal appointments and tests as scheduled. °SEEK MEDICAL CARE IF: °· You are gaining more weight than expected. °· You have any headaches, abdominal pain, or nausea. °· You are bruising more than usual. °· You feel dizzy or light-headed. °SEEK IMMEDIATE MEDICAL CARE IF:  °· You develop sudden or severe swelling anywhere in your body. This usually happens in the legs. °· You gain 5 lb (2.3 kg) or more in a week. °· You have a  severe headache, dizziness, problems with your vision, or confusion. °· You have severe abdominal pain. °· You have lasting nausea or vomiting. °· You have a seizure. °· You have trouble moving any part of your body. °· You develop numbness in your body. °· You have trouble speaking. °· You have any abnormal bleeding. °· You develop a stiff neck. °· You pass out. °MAKE SURE YOU:  °· Understand these instructions. °· Will watch your condition. °· Will get help right away if you are not doing well or get worse. °Document Released: 04/20/2000 Document Revised: 04/28/2013 Document Reviewed: 02/13/2013 °ExitCare® Patient Information ©2015 [employer]. This information is not intended to replace advice given to you by your health care provider. Make sure you discuss any questions you have with your health care provider. ° °

## 2014-04-16 ENCOUNTER — Inpatient Hospital Stay (HOSPITAL_COMMUNITY)
Admit: 2014-04-16 | Discharge: 2014-04-19 | DRG: 781 | Disposition: A | Discharge status: Home / Self Care | Payer: 59 | Source: Intra-hospital | Attending: Obstetrics and Gynecology | Admitting: Obstetrics and Gynecology

## 2014-04-16 ENCOUNTER — Inpatient Hospital Stay (HOSPITAL_COMMUNITY): Payer: 59

## 2014-04-16 ENCOUNTER — Encounter (HOSPITAL_COMMUNITY): Payer: Self-pay

## 2014-04-16 DIAGNOSIS — O09893 Supervision of other high risk pregnancies, third trimester: Secondary | ICD-10-CM

## 2014-04-16 DIAGNOSIS — O3421 Maternal care for scar from previous cesarean delivery: Secondary | ICD-10-CM | POA: Diagnosis present

## 2014-04-16 DIAGNOSIS — O34219 Maternal care for unspecified type scar from previous cesarean delivery: Secondary | ICD-10-CM

## 2014-04-16 DIAGNOSIS — Z141 Cystic fibrosis carrier: Secondary | ICD-10-CM

## 2014-04-16 DIAGNOSIS — Z3A35 35 weeks gestation of pregnancy: Secondary | ICD-10-CM | POA: Insufficient documentation

## 2014-04-16 DIAGNOSIS — Z823 Family history of stroke: Secondary | ICD-10-CM | POA: Diagnosis not present

## 2014-04-16 DIAGNOSIS — O10913 Unspecified pre-existing hypertension complicating pregnancy, third trimester: Secondary | ICD-10-CM | POA: Diagnosis not present

## 2014-04-16 DIAGNOSIS — O283 Abnormal ultrasonic finding on antenatal screening of mother: Secondary | ICD-10-CM | POA: Diagnosis present

## 2014-04-16 DIAGNOSIS — Z8774 Personal history of (corrected) congenital malformations of heart and circulatory system: Secondary | ICD-10-CM

## 2014-04-16 DIAGNOSIS — O99213 Obesity complicating pregnancy, third trimester: Secondary | ICD-10-CM

## 2014-04-16 DIAGNOSIS — I10 Essential (primary) hypertension: Secondary | ICD-10-CM

## 2014-04-16 DIAGNOSIS — Z8249 Family history of ischemic heart disease and other diseases of the circulatory system: Secondary | ICD-10-CM | POA: Diagnosis not present

## 2014-04-16 DIAGNOSIS — O163 Unspecified maternal hypertension, third trimester: Secondary | ICD-10-CM | POA: Insufficient documentation

## 2014-04-16 LAB — URINALYSIS, ROUTINE W REFLEX MICROSCOPIC
Bilirubin Urine: NEGATIVE
GLUCOSE, UA: NEGATIVE mg/dL
Hgb urine dipstick: NEGATIVE
KETONES UR: NEGATIVE mg/dL
Nitrite: NEGATIVE
Protein, ur: NEGATIVE mg/dL
Specific Gravity, Urine: 1.01 (ref 1.005–1.030)
Urobilinogen, UA: 0.2 mg/dL (ref 0.0–1.0)
pH: 7 (ref 5.0–8.0)

## 2014-04-16 LAB — COMPREHENSIVE METABOLIC PANEL
ALT: 33 U/L (ref 0–35)
AST: 48 U/L — AB (ref 0–37)
Albumin: 2.6 g/dL — ABNORMAL LOW (ref 3.5–5.2)
Alkaline Phosphatase: 86 U/L (ref 39–117)
Anion gap: 13 (ref 5–15)
BILIRUBIN TOTAL: 0.3 mg/dL (ref 0.3–1.2)
BUN: 10 mg/dL (ref 6–23)
CO2: 23 meq/L (ref 19–32)
CREATININE: 0.67 mg/dL (ref 0.50–1.10)
Calcium: 9.5 mg/dL (ref 8.4–10.5)
Chloride: 100 mEq/L (ref 96–112)
GFR calc Af Amer: >90 mL/min (ref >90)
Glucose, Bld: 79 mg/dL (ref 70–99)
Potassium: 4.9 mEq/L (ref 3.7–5.3)
SODIUM: 136 meq/L — AB (ref 137–147)
Total Protein: 6.5 g/dL (ref 6.0–8.3)

## 2014-04-16 LAB — CBC
HCT: 41.1 % (ref 36.0–46.0)
HEMOGLOBIN: 13.8 g/dL (ref 12.0–15.0)
MCH: 30.8 pg (ref 26.0–34.0)
MCHC: 33.6 g/dL (ref 30.0–36.0)
MCV: 91.7 fL (ref 78.0–100.0)
Platelets: 269 10*3/uL (ref 150–400)
RBC: 4.48 MIL/uL (ref 3.87–5.11)
RDW: 14.1 % (ref 11.5–15.5)
WBC: 10.2 10*3/uL (ref 4.0–10.5)

## 2014-04-16 LAB — URINE MICROSCOPIC-ADD ON

## 2014-04-16 LAB — URIC ACID: Uric Acid, Serum: 5.3 mg/dL (ref 2.4–7.0)

## 2014-04-16 LAB — LACTATE DEHYDROGENASE: LDH: 179 U/L (ref 94–250)

## 2014-04-16 LAB — TYPE AND SCREEN
ABO/RH(D): B POS
ANTIBODY SCREEN: NEGATIVE

## 2014-04-16 MED ORDER — CALCIUM CARBONATE ANTACID 500 MG PO CHEW: 2.0000 | CHEWABLE_TABLET | ORAL | Status: DC | PRN

## 2014-04-16 MED ORDER — ACETAMINOPHEN 325 MG PO TABS: 650.0000 mg | ORAL_TABLET | ORAL | Status: DC | PRN

## 2014-04-16 MED ORDER — PRENATAL MULTIVITAMIN CH
1.0000 | ORAL_TABLET | Freq: Every day | ORAL | Status: DC
  Administered 2014-04-16 – 2014-04-18 (×3): 1 via ORAL
  Filled 2014-04-16 (×3): qty 1

## 2014-04-16 MED ORDER — ZOLPIDEM TARTRATE 5 MG PO TABS: 5.0000 mg | ORAL_TABLET | Freq: Every evening | ORAL | Status: DC | PRN

## 2014-04-16 MED ORDER — DOCUSATE SODIUM 100 MG PO CAPS
100.0000 mg | ORAL_CAPSULE | Freq: Every day | ORAL | Status: DC
  Filled 2014-04-16 (×2): qty 1

## 2014-04-16 MED ORDER — PRENATAL MULTIVITAMIN CH: 1.0000 | ORAL_TABLET | Freq: Every day | ORAL | Status: DC

## 2014-04-16 NOTE — H&P (Signed)
Kim Sharp is a 28 y.o. G 2 P 1001 at 35 weeks and 1 day. She has history of probable chronic hypertension. NST in office revealed flat tracing. BPP questionable absent end diastolic flow Sent to MAU.  While in MAU her tracing was Category 1.  MFM consultation revealed normal EFW and slightly abnormal Dopplers.  Admitted for continuous monitoring. Maternal Medical History:  Fetal activity: Perceived fetal activity is normal.      OB History    Gravida Para Term Preterm AB TAB SAB Ectopic Multiple Living   2 1 1       1      Past Medical History  Diagnosis Date   Atrial septal defect and mitral stenosis    VSD (ventricular septal defect)    Cystic fibrosis carrier    H/O varicella    Headache(784.0)    Obesity    Pregnancy induced hypertension    Past Surgical History  Procedure Laterality Date   Cesarean section  01/26/2012    Procedure: CESAREAN SECTION;  Surgeon: Meriel Picaichard M Holland, MD;  Location: [facility];  Service: Obstetrics;  Laterality: N/A;  Primary Cesarean Section Delivery Baby Boy @ [phone], Apgar 9/9    Cardiac surgery      asd and vsd repair   Family History: family history includes Heart disease in her paternal grandmother; Hypertension in her father; Stroke in her father. Social History:  reports that she has never smoked. She has never used smokeless tobacco. She reports that she does not drink alcohol or use illicit drugs.   Prenatal Transfer Tool  Maternal Diabetes: No Genetic Screening: Normal Maternal Ultrasounds/Referrals: Abnormal:  Findings:   Other: Dopplers Fetal Ultrasounds or other Referrals:  Fetal echo, Referred to Materal Fetal Medicine  Maternal Substance Abuse:  No Significant Maternal Medications:  None Significant Maternal Lab Results:  None Other Comments:  None  Review of Systems  All other systems reviewed and are negative.     Blood pressure 153/86, pulse 84, last menstrual period 08/13/2013. Maternal Exam:  Abdomen: Fetal  presentation: vertex     Fetal Exam Fetal State Assessment: Category I - tracings are normal.     Physical Exam  Nursing note and vitals reviewed. Constitutional: She appears well-developed.  HENT:  Head: Normocephalic.  Eyes: Pupils are equal, round, and reactive to light.  Neck: Normal range of motion.  Cardiovascular: Normal rate.   Respiratory: Effort normal.    Prenatal labs: ABO, Rh:   Antibody:   Rubella:   RPR:    HBsAg:    HIV:    GBS:     Assessment/Plan: IUP at 35 w 1 day Probable Chronic Hypertension - possible superimposed PIH Previous C Section Abnormal Dopplers Admit for continous monitoring Discussion with Dr. Vincenza HewsQuinn - MFM  Will follow up Doppler on this Monday  Check CBC, CMET and Uric Acid Minas Bonser L 04/16/2014, 4:25 PM

## 2014-04-16 NOTE — MAU Note (Signed)
Pt presents from Dr. Vincente PoliGrewal office for evaluation and monitoring.

## 2014-04-16 NOTE — Consult Note (Signed)
MFM consult   28 yr old G2P1001 at 7477w1d with chronic hypertension referred by Dr. Vincente PoliGrewal for BPP, growth, Doppler studies, and consult secondary to BPP 6/10 and concern for abnormal Doppler studies on outside ultrasound.  Ultrasound today shows: single intrauterine pregnancy. Estimated fetal weight is in the 46th%. Posterior placenta without evidence of previa. Normal amniotic fluid index.The limited anatomy survey is normal. Elevated S/D ratio on umbilical artery Doppler studies. Biophysical profile is 6/8 (-2 for breathing).  Recommendations: 1. Appropriate fetal growth. 2. Hypertension: - well controlled off of medication; although has had multiple upper range mild blood pressures - recommend CBC, AST, ALT, BUN, creatinine, urine P/C ratio - monitor blood pressures closely - continue antenatal testing -  monitor closely for signs/symptoms of preeclampsia 3. Elevated Doppler studies: - in the setting of appropriate fetal growth it is difficult to interpret - there is a nuchal cord so this may have some effect - given appropriate growth do not recommend delivery today in the setting of reassuring testing (overall BPP was 8/10) - recommend admission for fetal monitoring tonight - repeat Doppler studies on 12/14 and advise from there 4. Patient is scheduled for repeat C section on 12/29- recommend early delivery if clinically indicated 5. Patient has a history of ASD and VSD repair: - previously counseled - had echocardiogram- see report - normal limited fetal echocardiogram 6. Cystic fibrosis carrier: - inform Pediatrics at delivery - unsure if father of the baby was screened 7. Recommend fetal kick counts  Discussed with Dr. Vincente PoliGrewal Patient sent to antepartum  I spent a total of 45 minutes with the patient of which >50% was in face to face consultation. All of the patient's questions were answered. Please call with questions.  Eulis FosterKristen Nicholous Girgenti, MD

## 2014-04-16 NOTE — Progress Notes (Signed)
[facility] ultrasound  Indication: 28 yr old G2P1001 at 676w1d with chronic hypertension for BPP, growth, and Doppler studies secondary to BPP 6/10 and concern for abnormal Doppler studies on outside ultrasound.  Findings: 1. Single intrauterine pregnancy. 2. Estimated fetal weight is in the 46th%. 3. Posterior placenta without evidence of previa. 4. Normal amniotic fluid index. 5. The limited anatomy survey is normal. 6. Elevated S/D ratio on umbilical artery Doppler studies. 7. Biophysical profile is 6/8 (-2 for breathing).  Recommendations: 1. Appropriate fetal growth. 2. Hypertension: - well controlled off of medication; although has had multiple upper range mild blood pressures - recommend CBC, AST, ALT, BUN, creatinine, urine P/C ratio - monitor blood pressures closely - continue antenatal testing -  monitor closely for signs/symptoms of preeclampsia 3. Elevated Doppler studies: - in the setting of appropriate fetal growth it is difficult to interpret - there is a nuchal cord so this may have some effect - given appropriate growth do not recommend delivery today in the setting of reassuring testing (overall BPP was 8/10) - recommend admission for fetal monitoring tonight - repeat Doppler studies on 12/14 and advise from there 4. Patient is scheduled for repeat C section on 12/29- recommend early delivery if clinically indicated 5. Patient has a history of ASD and VSD repair: - previously counseled - had echocardiogram- see report - normal limited fetal echocardiogram  Discussed with Dr. Vincente PoliGrewal Patient sent to antepartum  Eulis FosterKristen Misa Fedorko, MD

## 2014-04-16 NOTE — MAU Provider Note (Signed)
Chief Complaint:  Fetal Monitoring    None     HPI: Kim Sharp is a 28 y.o. G2P1001 at 3635w1dwho presents to maternity admissions sent from MFM for abnormal fetal ultrasound.  She reports good fetal movement, denies h/a, epigastric pain, visual disturbances, LOF, vaginal bleeding, vaginal itching/burning, urinary symptoms, dizziness, n/v, or fever/chills.     Past Medical History: Past Medical History  Diagnosis Date   Atrial septal defect and mitral stenosis    VSD (ventricular septal defect)    Cystic fibrosis carrier    H/O varicella    Headache(784.0)    Obesity    Pregnancy induced hypertension     Past obstetric history: OB History  Gravida Para Term Preterm AB SAB TAB Ectopic Multiple Living  2 1 1       1     # Outcome Date GA Lbr Len/2nd Weight Sex Delivery Anes PTL Lv  2 Current           1 Term 01/26/12 967w3d 08:02 / 03:03 3.645 kg (8 lb 0.6 oz) M CS-LVertical EPI  Y      Past Surgical History: Past Surgical History  Procedure Laterality Date   Cesarean section  01/26/2012    Procedure: CESAREAN SECTION;  Surgeon: Meriel Picaichard M Holland, MD;  Location: [facility];  Service: Obstetrics;  Laterality: N/A;  Primary Cesarean Section Delivery Baby Boy @ [phone], Apgar 9/9    Cardiac surgery      asd and vsd repair    Family History: Family History  Problem Relation Age of Onset   Hypertension Father    Stroke Father    Heart disease Paternal Grandmother     Social History: History  Substance Use Topics   Smoking status: Never Smoker    Smokeless tobacco: Never Used   Alcohol Use: No    Allergies:  Allergies  Allergen Reactions   Codeine Nausea Only    Causes nausea.   Penicillins Nausea Only    Causes nausea.   Stadol [Butorphanol] Other (See Comments)    Caused hallucinations.    Meds:  Prescriptions prior to admission  Medication Sig Dispense Refill Last Dose   acetaminophen (TYLENOL) 500 MG tablet Take 500-1,000 mg by mouth every  6 (six) hours as needed for mild pain.   04/15/2014 at Unknown time   calcium carbonate (TUMS EX) 750 MG chewable tablet Chew 1 tablet by mouth 3 (three) times daily as needed for heartburn.   04/15/2014 at Unknown time   famotidine (PEPCID) 20 MG tablet Take 20 mg by mouth daily.   04/16/2014 at Unknown time   Prenatal Vit-Fe Fumarate-FA (PRENATAL MULTIVITAMIN) TABS tablet Take 1 tablet by mouth at bedtime.   04/15/2014 at Unknown time    ROS: Pertinent findings in history of present illness.  Physical Exam  Blood pressure 153/86, pulse 84, last menstrual period 08/13/2013.   Patient Vitals for the past 24 hrs:  BP Pulse  04/16/14 1245 153/86 mmHg 84  04/16/14 1234 156/87 mmHg 82   GENERAL: Well-developed, well-nourished female in no acute distress.  HEENT: normocephalic HEART: normal rate RESP: normal effort ABDOMEN: Soft, non-tender, gravid appropriate for gestational age EXTREMITIES: Nontender, no edema NEURO: alert and oriented    FHT:  Baseline 125, moderate variability, accelerations present, no decelerations Contractions: Occasional irritability, mild to palpation  Verbal report of abnormal fetal ultrasound/abnormal dopplers from U/S  Assessment: 1. Abnormal fetal ultrasound   2. Chronic hypertension   3. Elevated blood pressure affecting pregnancy  in third trimester, antepartum     Plan: Consult Dr Vincente PoliGrewal Admit to antepartum Preeclampsia labs Pt to MFM for additional ultrasound Dr Vincente PoliGrewal to see pt    Medication List    ASK your doctor about these medications        acetaminophen 500 MG tablet  Commonly known as:  TYLENOL  Take 500-1,000 mg by mouth every 6 (six) hours as needed for mild pain.     calcium carbonate 750 MG chewable tablet  Commonly known as:  TUMS EX  Chew 1 tablet by mouth 3 (three) times daily as needed for heartburn.     famotidine 20 MG tablet  Commonly known as:  PEPCID  Take 20 mg by mouth daily.     prenatal multivitamin  Tabs tablet  Take 1 tablet by mouth at bedtime.        Sharen CounterLisa Leftwich-Kirby Certified Nurse-Midwife 04/16/2014 3:09 PM

## 2014-04-17 LAB — COMPREHENSIVE METABOLIC PANEL
ALK PHOS: 83 U/L (ref 39–117)
ALT: 34 U/L (ref 0–35)
ANION GAP: 12 (ref 5–15)
AST: 54 U/L — ABNORMAL HIGH (ref 0–37)
Albumin: 2.5 g/dL — ABNORMAL LOW (ref 3.5–5.2)
BILIRUBIN TOTAL: 0.3 mg/dL (ref 0.3–1.2)
BUN: 13 mg/dL (ref 6–23)
CHLORIDE: 100 meq/L (ref 96–112)
CO2: 24 mEq/L (ref 19–32)
Calcium: 9.2 mg/dL (ref 8.4–10.5)
Creatinine, Ser: 0.66 mg/dL (ref 0.50–1.10)
GFR calc non Af Amer: >90 mL/min (ref >90)
Glucose, Bld: 80 mg/dL (ref 70–99)
POTASSIUM: 4.6 meq/L (ref 3.7–5.3)
SODIUM: 136 meq/L — AB (ref 137–147)
TOTAL PROTEIN: 6.5 g/dL (ref 6.0–8.3)

## 2014-04-17 LAB — CBC
HEMATOCRIT: 41.2 % (ref 36.0–46.0)
HEMOGLOBIN: 13.7 g/dL (ref 12.0–15.0)
MCH: 30.8 pg (ref 26.0–34.0)
MCHC: 33.3 g/dL (ref 30.0–36.0)
MCV: 92.6 fL (ref 78.0–100.0)
Platelets: 254 10*3/uL (ref 150–400)
RBC: 4.45 MIL/uL (ref 3.87–5.11)
RDW: 14.4 % (ref 11.5–15.5)
WBC: 9.9 10*3/uL (ref 4.0–10.5)

## 2014-04-17 NOTE — Progress Notes (Signed)
S:  Overnight, FHR Category 1. No complaints this am.  O: BP is normal range - improved for this patient General alert and oriented Lung CTAB Car RRR Labs Platelets normal 1 liver enzyme slightly elevated times x 2 but patient reports daily tylenol use over past week  IMPRESSION: IUP at 35 w 2 days Previous C Section Elevated Doppler Studies Chronic Hypertension  PLAN: Continuous Monitoring - of note Dr. Vincenza HewsQuinn noted double nuchal cord x 2 yesterday Recheck CBC/CMET tomorrow Repeat Dopplers with Dr. Vincenza HewsQuinn on Monday

## 2014-04-18 LAB — COMPREHENSIVE METABOLIC PANEL
ALK PHOS: 80 U/L (ref 39–117)
ALT: 35 U/L (ref 0–35)
ANION GAP: 13 (ref 5–15)
AST: 58 U/L — ABNORMAL HIGH (ref 0–37)
Albumin: 2.3 g/dL — ABNORMAL LOW (ref 3.5–5.2)
BUN: 14 mg/dL (ref 6–23)
CO2: 22 mEq/L (ref 19–32)
CREATININE: 0.64 mg/dL (ref 0.50–1.10)
Calcium: 8.8 mg/dL (ref 8.4–10.5)
Chloride: 102 mEq/L (ref 96–112)
GFR calc Af Amer: >90 mL/min (ref >90)
Glucose, Bld: 79 mg/dL (ref 70–99)
Potassium: 4.3 mEq/L (ref 3.7–5.3)
Sodium: 137 mEq/L (ref 137–147)
Total Bilirubin: 0.2 mg/dL — ABNORMAL LOW (ref 0.3–1.2)
Total Protein: 5.8 g/dL — ABNORMAL LOW (ref 6.0–8.3)

## 2014-04-18 LAB — CBC
HEMATOCRIT: 39.9 % (ref 36.0–46.0)
HEMOGLOBIN: 13.3 g/dL (ref 12.0–15.0)
MCH: 30.7 pg (ref 26.0–34.0)
MCHC: 33.3 g/dL (ref 30.0–36.0)
MCV: 92.1 fL (ref 78.0–100.0)
Platelets: 227 10*3/uL (ref 150–400)
RBC: 4.33 MIL/uL (ref 3.87–5.11)
RDW: 14.2 % (ref 11.5–15.5)
WBC: 10.1 10*3/uL (ref 4.0–10.5)

## 2014-04-18 NOTE — Progress Notes (Signed)
S:  Patient is doing well, Reports good fetal movement.  O: FHR Category 1  VSS Platelets stable  1 liver enzyme still slightly elevated Patient reports daily tylenol use last week  IMPRESSION: IUP at 35 w 3 days Chronic hypertension Previous c section Elevated Doppler studies  PLAN: Dr. Vincenza HewsQuinn to perform doppler study on Monday Recheck CBC/CMET

## 2014-04-19 ENCOUNTER — Other Ambulatory Visit (HOSPITAL_COMMUNITY): Payer: Self-pay | Admitting: Obstetrics and Gynecology

## 2014-04-19 ENCOUNTER — Ambulatory Visit (HOSPITAL_COMMUNITY)
Admit: 2014-04-19 | Discharge: 2014-04-19 | Disposition: A | Discharge status: Home / Self Care | Payer: 59 | Attending: Obstetrics and Gynecology | Admitting: Obstetrics and Gynecology

## 2014-04-19 DIAGNOSIS — O10013 Pre-existing essential hypertension complicating pregnancy, third trimester: Secondary | ICD-10-CM

## 2014-04-19 DIAGNOSIS — O34219 Maternal care for unspecified type scar from previous cesarean delivery: Secondary | ICD-10-CM

## 2014-04-19 DIAGNOSIS — O99213 Obesity complicating pregnancy, third trimester: Secondary | ICD-10-CM | POA: Insufficient documentation

## 2014-04-19 DIAGNOSIS — Z141 Cystic fibrosis carrier: Secondary | ICD-10-CM

## 2014-04-19 DIAGNOSIS — O3421 Maternal care for scar from previous cesarean delivery: Secondary | ICD-10-CM | POA: Insufficient documentation

## 2014-04-19 DIAGNOSIS — O358XX0 Maternal care for other (suspected) fetal abnormality and damage, not applicable or unspecified: Secondary | ICD-10-CM

## 2014-04-19 DIAGNOSIS — O99210 Obesity complicating pregnancy, unspecified trimester: Secondary | ICD-10-CM | POA: Insufficient documentation

## 2014-04-19 DIAGNOSIS — O10919 Unspecified pre-existing hypertension complicating pregnancy, unspecified trimester: Secondary | ICD-10-CM | POA: Insufficient documentation

## 2014-04-19 DIAGNOSIS — Z3A35 35 weeks gestation of pregnancy: Secondary | ICD-10-CM

## 2014-04-19 DIAGNOSIS — Z8774 Personal history of (corrected) congenital malformations of heart and circulatory system: Secondary | ICD-10-CM

## 2014-04-19 DIAGNOSIS — O09893 Supervision of other high risk pregnancies, third trimester: Secondary | ICD-10-CM

## 2014-04-19 LAB — COMPREHENSIVE METABOLIC PANEL
ALT: 38 U/L — AB (ref 0–35)
AST: 60 U/L — AB (ref 0–37)
Albumin: 2.3 g/dL — ABNORMAL LOW (ref 3.5–5.2)
Alkaline Phosphatase: 85 U/L (ref 39–117)
Anion gap: 14 (ref 5–15)
BUN: 14 mg/dL (ref 6–23)
CO2: 21 meq/L (ref 19–32)
Calcium: 8.9 mg/dL (ref 8.4–10.5)
Chloride: 104 mEq/L (ref 96–112)
Creatinine, Ser: 0.62 mg/dL (ref 0.50–1.10)
GFR calc Af Amer: >90 mL/min (ref >90)
Glucose, Bld: 84 mg/dL (ref 70–99)
Potassium: 4.4 mEq/L (ref 3.7–5.3)
SODIUM: 139 meq/L (ref 137–147)
Total Bilirubin: 0.3 mg/dL (ref 0.3–1.2)
Total Protein: 5.9 g/dL — ABNORMAL LOW (ref 6.0–8.3)

## 2014-04-19 LAB — CBC
HCT: 39.4 % (ref 36.0–46.0)
Hemoglobin: 13.1 g/dL (ref 12.0–15.0)
MCH: 30.6 pg (ref 26.0–34.0)
MCHC: 33.2 g/dL (ref 30.0–36.0)
MCV: 92.1 fL (ref 78.0–100.0)
PLATELETS: 231 10*3/uL (ref 150–400)
RBC: 4.28 MIL/uL (ref 3.87–5.11)
RDW: 14.4 % (ref 11.5–15.5)
WBC: 9.9 10*3/uL (ref 4.0–10.5)

## 2014-04-19 NOTE — Progress Notes (Signed)
No complaints.  Denies HA, CP/SOB, RUQ pain or visual disturbance.    VSS.  BP normal to mild range. FHT Cat I PreE labs wnl except AST/ALT continue to increase daily (60/38)  Gen: A&O x 3 Abd: soft, gravid, NT Ext: no c/c/e  28yo G2P1001 at 1757w4d with CHTN, abnormal Dopplers -Plan to repeat Dopplers today -Continue to monitor BPs -Previous C/S  Harrah's EntertainmentMegan Omaya Nieland, dO

## 2014-04-19 NOTE — Discharge Summary (Signed)
Obstetric Discharge Summary Reason for Admission: observation/evaluation Prenatal Procedures: ultrasound Intrapartum Procedures: n/a Postpartum Procedures: n/a Complications-Operative and Postpartum: n/a HEMOGLOBIN  Date Value Ref Range Status  04/19/2014 13.1 12.0 - 15.0 g/dL Final   HCT  Date Value Ref Range Status  04/19/2014 39.4 36.0 - 46.0 % Final    Physical Exam:  General: alert, cooperative and appears stated age 60Lochia: n/a Uterine Fundus: soft Incision: n/a DVT Evaluation: No evidence of DVT seen on physical exam. Negative Homan's sign. No cords or calf tenderness.  Discharge Diagnoses: CHTN, elevated umbilical artery Dopplers  Discharge Information: Date: 04/19/2014 Activity: modified bedrest Diet: routine Medications: PNV Condition: stable Instructions: refer to practice specific booklet Discharge to: home   Newborn Data: This patient has no babies on file. Home with n/a.  Kim Sharp 04/19/2014, 2:06 PM

## 2014-04-19 NOTE — Progress Notes (Signed)
Discharge instructions reviewed with patient. Verbalized an understanding of instructions.

## 2014-04-19 NOTE — Discharge Instructions (Signed)
Call MD for headache unresponsive to tylenol, visual disturbance, right upper quadrant pain, or shortness of breath.  Continue modified bedrest at home.  Follow up at already scheduled appointment tomorrow.      Hypertension During Pregnancy  Hypertension, or high blood pressure, is when there is extra pressure inside your blood vessels that carry blood from the heart to the rest of your body (arteries). It can happen at any time in life, including pregnancy. Hypertension during pregnancy can cause problems for you and your baby. Your baby might not weigh as much as he or she should at birth or might be born early (premature). Very bad cases of hypertension during pregnancy can be life-threatening.  Different types of hypertension can occur during pregnancy. These include:  Chronic hypertension. This happens when a woman has hypertension before pregnancy and it continues during pregnancy.  Gestational hypertension. This is when hypertension develops during pregnancy.  Preeclampsia or toxemia of pregnancy. This is a very serious type of hypertension that develops only during pregnancy. It affects the whole body and can be very dangerous for both mother and baby.  Gestational hypertension and preeclampsia usually go away after your baby is born. Your blood pressure will likely stabilize within 6 weeks. Women who have hypertension during pregnancy have a greater chance of developing hypertension later in life or with future pregnancies. RISK FACTORS There are certain factors that make it more likely for you to develop hypertension during pregnancy. These include:  Having hypertension before pregnancy.  Having hypertension during a previous pregnancy.  Being overweight.  Being older than 40 years.  Being pregnant with more than one baby.  Having diabetes or kidney problems. SIGNS AND SYMPTOMS Chronic and gestational hypertension rarely cause symptoms. Preeclampsia has symptoms, which may  include:  Increased protein in your urine. Your health care provider will check for this at every prenatal visit.  Swelling of your hands and face.  Rapid weight gain.  Headaches.  Visual changes.  Being bothered by light.  Abdominal pain, especially in the upper right area.  Chest pain.  Shortness of breath.  Increased reflexes.  Seizures. These occur with a more severe form of preeclampsia, called eclampsia. DIAGNOSIS  You may be diagnosed with hypertension during a regular prenatal exam. At each prenatal visit, you may have:  Your blood pressure checked.  A urine test to check for protein in your urine. The type of hypertension you are diagnosed with depends on when you developed it. It also depends on your specific blood pressure reading.  Developing hypertension before 20 weeks of pregnancy is consistent with chronic hypertension.  Developing hypertension after 20 weeks of pregnancy is consistent with gestational hypertension.  Hypertension with increased urinary protein is diagnosed as preeclampsia.  Blood pressure measurements that stay above 160 systolic or 110 diastolic are a sign of severe preeclampsia. TREATMENT Treatment for hypertension during pregnancy varies. Treatment depends on the type of hypertension and how serious it is.  If you take medicine for chronic hypertension, you may need to switch medicines.  Medicines called ACE inhibitors should not be taken during pregnancy.  Low-dose aspirin may be suggested for women who have risk factors for preeclampsia.  If you have gestational hypertension, you may need to take a blood pressure medicine that is safe during pregnancy. Your health care provider will recommend the correct medicine.  If you have severe preeclampsia, you may need to be in the hospital. Health care providers will watch you and your baby very  closely. You also may need to take medicine called magnesium sulfate to prevent seizures and  lower blood pressure.  Sometimes, an early delivery is needed. This may be the case if the condition worsens. It would be done to protect you and your baby. The only cure for preeclampsia is delivery.  Your health care provider may recommend that you take one low-dose aspirin (81 mg) each day to help prevent high blood pressure during your pregnancy if you are at risk for preeclampsia. You may be at risk for preeclampsia if:  You had preeclampsia or eclampsia during a previous pregnancy.  Your baby did not grow as expected during a previous pregnancy.  You experienced preterm birth with a previous pregnancy.  You experienced a separation of the placenta from the uterus (placental abruption) during a previous pregnancy.  You experienced the loss of your baby during a previous pregnancy.  You are pregnant with more than one baby.  You have other medical conditions, such as diabetes or an autoimmune disease. HOME CARE INSTRUCTIONS  Schedule and keep all of your regular prenatal care appointments. This is important.  Take medicines only as directed by your health care provider. Tell your health care provider about all medicines you take.  Eat as little salt as possible.  Get regular exercise.  Do not drink alcohol.  Do not use tobacco products.  Do not drink products with caffeine.  Lie on your left side when resting. SEEK IMMEDIATE MEDICAL CARE IF:  You have severe abdominal pain.  You have sudden swelling in your hands, ankles, or face.  You gain 4 pounds (1.8 kg) or more in 1 week.  You vomit repeatedly.  You have vaginal bleeding.  You do not feel your baby moving as much.  You have a headache.  You have blurred or double vision.  You have muscle twitching or spasms.  You have shortness of breath.  You have blue fingernails or lips.  You have blood in your urine. MAKE SURE YOU:  Understand these instructions.  Will watch your condition.  Will get  help right away if you are not doing well or get worse. Document Released: 01/09/2011 Document Revised: 09/07/2013 Document Reviewed: 11/20/2012 [facility] Patient Information 2015 [employer] [place]. This information is not intended to replace advice given to you by your health care provider. Make sure you discuss any questions you have with your health care provider.

## 2014-04-19 NOTE — Progress Notes (Signed)
Spoke with Dr. Sherrie Georgeecker about ultrasound/Dopplers.  No change in UA Doppler elevation; nl fluid.  Will plan for outpt follow-up with 2/wk office visits and labs.  We will also plan to schedule repeat C/S at 37 weeks rather than 38 weeks.    Mitchel HonourMegan Gazelle Towe, DO

## 2014-04-19 NOTE — Progress Notes (Signed)
Ur chart review completed.

## 2014-04-20 ENCOUNTER — Inpatient Hospital Stay (HOSPITAL_COMMUNITY): Payer: 59 | Admitting: Anesthesiology

## 2014-04-20 ENCOUNTER — Encounter (HOSPITAL_COMMUNITY)
Admission: AD | Disposition: A | Discharge status: Home / Self Care | Payer: Self-pay | Source: Ambulatory Visit | Attending: Obstetrics and Gynecology

## 2014-04-20 ENCOUNTER — Encounter (HOSPITAL_COMMUNITY): Payer: Self-pay

## 2014-04-20 ENCOUNTER — Inpatient Hospital Stay (HOSPITAL_COMMUNITY)
Admission: AD | Admit: 2014-04-20 | Discharge: 2014-04-24 | DRG: 765 | Disposition: A | Discharge status: Home / Self Care | Payer: 59 | Source: Ambulatory Visit | Attending: Obstetrics and Gynecology | Admitting: Obstetrics and Gynecology

## 2014-04-20 DIAGNOSIS — Z8249 Family history of ischemic heart disease and other diseases of the circulatory system: Secondary | ICD-10-CM

## 2014-04-20 DIAGNOSIS — O113 Pre-existing hypertension with pre-eclampsia, third trimester: Secondary | ICD-10-CM | POA: Diagnosis present

## 2014-04-20 DIAGNOSIS — R945 Abnormal results of liver function studies: Secondary | ICD-10-CM | POA: Diagnosis present

## 2014-04-20 DIAGNOSIS — O3421 Maternal care for scar from previous cesarean delivery: Secondary | ICD-10-CM | POA: Diagnosis present

## 2014-04-20 DIAGNOSIS — Z3A35 35 weeks gestation of pregnancy: Secondary | ICD-10-CM | POA: Diagnosis present

## 2014-04-20 DIAGNOSIS — O119 Pre-existing hypertension with pre-eclampsia, unspecified trimester: Secondary | ICD-10-CM | POA: Diagnosis present

## 2014-04-20 DIAGNOSIS — O99214 Obesity complicating childbirth: Secondary | ICD-10-CM | POA: Diagnosis present

## 2014-04-20 DIAGNOSIS — Z6841 Body Mass Index (BMI) 40.0 and over, adult: Secondary | ICD-10-CM | POA: Diagnosis not present

## 2014-04-20 DIAGNOSIS — O1490 Unspecified pre-eclampsia, unspecified trimester: Secondary | ICD-10-CM | POA: Diagnosis present

## 2014-04-20 DIAGNOSIS — I959 Hypotension, unspecified: Secondary | ICD-10-CM | POA: Diagnosis present

## 2014-04-20 DIAGNOSIS — O133 Gestational [pregnancy-induced] hypertension without significant proteinuria, third trimester: Secondary | ICD-10-CM | POA: Diagnosis present

## 2014-04-20 DIAGNOSIS — R34 Anuria and oliguria: Secondary | ICD-10-CM

## 2014-04-20 DIAGNOSIS — Z823 Family history of stroke: Secondary | ICD-10-CM | POA: Diagnosis not present

## 2014-04-20 DIAGNOSIS — O1493 Unspecified pre-eclampsia, third trimester: Secondary | ICD-10-CM

## 2014-04-20 LAB — COMPREHENSIVE METABOLIC PANEL
ALT: 41 U/L — ABNORMAL HIGH (ref 0–35)
ANION GAP: 13 (ref 5–15)
AST: 56 U/L — AB (ref 0–37)
Albumin: 2.5 g/dL — ABNORMAL LOW (ref 3.5–5.2)
Alkaline Phosphatase: 94 U/L (ref 39–117)
BUN: 13 mg/dL (ref 6–23)
CALCIUM: 9.2 mg/dL (ref 8.4–10.5)
CO2: 22 meq/L (ref 19–32)
CREATININE: 0.69 mg/dL (ref 0.50–1.10)
Chloride: 101 mEq/L (ref 96–112)
GFR calc Af Amer: >90 mL/min (ref >90)
Glucose, Bld: 101 mg/dL — ABNORMAL HIGH (ref 70–99)
Potassium: 4.5 mEq/L (ref 3.7–5.3)
Sodium: 136 mEq/L — ABNORMAL LOW (ref 137–147)
Total Bilirubin: <0.2 mg/dL — ABNORMAL LOW (ref 0.3–1.2)
Total Protein: 6.6 g/dL (ref 6.0–8.3)

## 2014-04-20 LAB — URIC ACID: Uric Acid, Serum: 4.7 mg/dL (ref 2.4–7.0)

## 2014-04-20 LAB — CBC
HEMATOCRIT: 40.6 % (ref 36.0–46.0)
Hemoglobin: 13.6 g/dL (ref 12.0–15.0)
MCH: 30.8 pg (ref 26.0–34.0)
MCHC: 33.5 g/dL (ref 30.0–36.0)
MCV: 91.9 fL (ref 78.0–100.0)
PLATELETS: 244 10*3/uL (ref 150–400)
RBC: 4.42 MIL/uL (ref 3.87–5.11)
RDW: 14 % (ref 11.5–15.5)
WBC: 10 10*3/uL (ref 4.0–10.5)

## 2014-04-20 LAB — PROTEIN / CREATININE RATIO, URINE
CREATININE, URINE: 85.15 mg/dL
Protein Creatinine Ratio: 0.36 — ABNORMAL HIGH (ref 0.00–0.15)
TOTAL PROTEIN, URINE: 30.3 mg/dL

## 2014-04-20 LAB — TYPE AND SCREEN
ABO/RH(D): B POS
Antibody Screen: NEGATIVE

## 2014-04-20 SURGERY — Surgical Case
Anesthesia: Spinal

## 2014-04-20 MED ORDER — SODIUM CHLORIDE 0.9 % IJ SOLN: 3.0000 mL | INTRAMUSCULAR | Status: DC | PRN

## 2014-04-20 MED ORDER — CITRIC ACID-SODIUM CITRATE 334-500 MG/5ML PO SOLN
30.0000 mL | Freq: Once | ORAL | Status: AC
  Administered 2014-04-20: 30 mL via ORAL
  Filled 2014-04-20: qty 15

## 2014-04-20 MED ORDER — MENTHOL 3 MG MT LOZG: 1.0000 | LOZENGE | OROMUCOSAL | Status: DC | PRN

## 2014-04-20 MED ORDER — ZOLPIDEM TARTRATE 5 MG PO TABS: 5.0000 mg | ORAL_TABLET | Freq: Every evening | ORAL | Status: DC | PRN

## 2014-04-20 MED ORDER — FAMOTIDINE IN NACL 20-0.9 MG/50ML-% IV SOLN
20.0000 mg | Freq: Once | INTRAVENOUS | Status: AC
  Administered 2014-04-20: 20 mg via INTRAVENOUS
  Filled 2014-04-20: qty 50

## 2014-04-20 MED ORDER — OXYTOCIN 10 UNIT/ML IJ SOLN
40.0000 [IU] | INTRAMUSCULAR | Status: DC | PRN
  Administered 2014-04-20: 40 [IU] via INTRAVENOUS

## 2014-04-20 MED ORDER — SODIUM CHLORIDE 0.9 % IJ SOLN
INTRAMUSCULAR | Status: AC
  Filled 2014-04-20: qty 20

## 2014-04-20 MED ORDER — ONDANSETRON HCL 4 MG/2ML IJ SOLN
INTRAMUSCULAR | Status: DC | PRN
  Administered 2014-04-20: 4 mg via INTRAVENOUS

## 2014-04-20 MED ORDER — DIBUCAINE 1 % RE OINT: 1.0000 "application " | TOPICAL_OINTMENT | RECTAL | Status: DC | PRN

## 2014-04-20 MED ORDER — PHENYLEPHRINE 8 MG IN D5W 100 ML (0.08MG/ML) PREMIX OPTIME
INJECTION | INTRAVENOUS | Status: AC
  Filled 2014-04-20: qty 100

## 2014-04-20 MED ORDER — FENTANYL CITRATE 0.05 MG/ML IJ SOLN: 25.0000 ug | INTRAMUSCULAR | Status: DC | PRN

## 2014-04-20 MED ORDER — ACETAMINOPHEN 500 MG PO TABS
1000.0000 mg | ORAL_TABLET | Freq: Four times a day (QID) | ORAL | Status: AC
  Administered 2014-04-20 – 2014-04-21 (×4): 1000 mg via ORAL
  Filled 2014-04-20 (×4): qty 2

## 2014-04-20 MED ORDER — IBUPROFEN 600 MG PO TABS: 600.0000 mg | ORAL_TABLET | Freq: Four times a day (QID) | ORAL | Status: DC | PRN

## 2014-04-20 MED ORDER — ONDANSETRON HCL 4 MG/2ML IJ SOLN: 4.0000 mg | INTRAMUSCULAR | Status: DC | PRN

## 2014-04-20 MED ORDER — DIPHENHYDRAMINE HCL 25 MG PO CAPS: 25.0000 mg | ORAL_CAPSULE | Freq: Four times a day (QID) | ORAL | Status: DC | PRN

## 2014-04-20 MED ORDER — ONDANSETRON HCL 4 MG PO TABS: 4.0000 mg | ORAL_TABLET | ORAL | Status: DC | PRN

## 2014-04-20 MED ORDER — NALOXONE HCL 0.4 MG/ML IJ SOLN: 0.4000 mg | INTRAMUSCULAR | Status: DC | PRN

## 2014-04-20 MED ORDER — BUPIVACAINE LIPOSOME 1.3 % IJ SUSP
INTRAMUSCULAR | Status: DC | PRN
  Administered 2014-04-20: 20 mL

## 2014-04-20 MED ORDER — ONDANSETRON HCL 4 MG/2ML IJ SOLN
INTRAMUSCULAR | Status: AC
  Filled 2014-04-20: qty 2

## 2014-04-20 MED ORDER — DIPHENHYDRAMINE HCL 25 MG PO CAPS: 25.0000 mg | ORAL_CAPSULE | ORAL | Status: DC | PRN

## 2014-04-20 MED ORDER — MIDAZOLAM HCL 2 MG/2ML IJ SOLN: 0.5000 mg | Freq: Once | INTRAMUSCULAR | Status: DC | PRN

## 2014-04-20 MED ORDER — PRENATAL MULTIVITAMIN CH
1.0000 | ORAL_TABLET | Freq: Every day | ORAL | Status: DC
  Administered 2014-04-21 – 2014-04-23 (×3): 1 via ORAL
  Filled 2014-04-20 (×3): qty 1

## 2014-04-20 MED ORDER — PHENYLEPHRINE HCL 10 MG/ML IJ SOLN
INTRAMUSCULAR | Status: DC | PRN
  Administered 2014-04-20: 80 ug via INTRAVENOUS
  Administered 2014-04-20: 40 ug via INTRAVENOUS
  Administered 2014-04-20: 80 ug via INTRAVENOUS

## 2014-04-20 MED ORDER — FENTANYL CITRATE 0.05 MG/ML IJ SOLN
INTRAMUSCULAR | Status: DC | PRN
  Administered 2014-04-20: 25 ug via INTRATHECAL

## 2014-04-20 MED ORDER — MORPHINE SULFATE 0.5 MG/ML IJ SOLN
INTRAMUSCULAR | Status: AC
  Filled 2014-04-20: qty 10

## 2014-04-20 MED ORDER — DEXAMETHASONE SODIUM PHOSPHATE 10 MG/ML IJ SOLN
INTRAMUSCULAR | Status: AC
  Filled 2014-04-20: qty 1

## 2014-04-20 MED ORDER — MORPHINE SULFATE (PF) 0.5 MG/ML IJ SOLN
INTRAMUSCULAR | Status: DC | PRN
  Administered 2014-04-20: .15 mg via EPIDURAL

## 2014-04-20 MED ORDER — SCOPOLAMINE 1 MG/3DAYS TD PT72
MEDICATED_PATCH | TRANSDERMAL | Status: AC
  Filled 2014-04-20: qty 1

## 2014-04-20 MED ORDER — PHENYLEPHRINE 40 MCG/ML (10ML) SYRINGE FOR IV PUSH (FOR BLOOD PRESSURE SUPPORT)
PREFILLED_SYRINGE | INTRAVENOUS | Status: AC
  Filled 2014-04-20: qty 5

## 2014-04-20 MED ORDER — BUPIVACAINE IN DEXTROSE 0.75-8.25 % IT SOLN
INTRATHECAL | Status: DC | PRN
  Administered 2014-04-20: 1.7 mL via INTRATHECAL

## 2014-04-20 MED ORDER — SIMETHICONE 80 MG PO CHEW
80.0000 mg | CHEWABLE_TABLET | Freq: Three times a day (TID) | ORAL | Status: DC
  Administered 2014-04-21 – 2014-04-24 (×10): 80 mg via ORAL
  Filled 2014-04-20 (×10): qty 1

## 2014-04-20 MED ORDER — FENTANYL CITRATE 0.05 MG/ML IJ SOLN
INTRAMUSCULAR | Status: AC
  Filled 2014-04-20: qty 2

## 2014-04-20 MED ORDER — SENNOSIDES-DOCUSATE SODIUM 8.6-50 MG PO TABS
2.0000 | ORAL_TABLET | ORAL | Status: DC
  Administered 2014-04-20 – 2014-04-23 (×4): 2 via ORAL
  Filled 2014-04-20 (×4): qty 2

## 2014-04-20 MED ORDER — LANOLIN HYDROUS EX OINT: 1.0000 "application " | TOPICAL_OINTMENT | CUTANEOUS | Status: DC | PRN

## 2014-04-20 MED ORDER — DIPHENHYDRAMINE HCL 50 MG/ML IJ SOLN
12.5000 mg | INTRAMUSCULAR | Status: DC | PRN
  Administered 2014-04-20: 12.5 mg via INTRAVENOUS
  Filled 2014-04-20: qty 1

## 2014-04-20 MED ORDER — NALOXONE HCL 1 MG/ML IJ SOLN
1.0000 ug/kg/h | INTRAVENOUS | Status: DC | PRN
  Filled 2014-04-20: qty 2

## 2014-04-20 MED ORDER — LACTATED RINGERS IV SOLN
INTRAVENOUS | Status: DC | PRN
  Administered 2014-04-20: 20:00:00 via INTRAVENOUS

## 2014-04-20 MED ORDER — PROMETHAZINE HCL 25 MG/ML IJ SOLN
INTRAMUSCULAR | Status: AC
  Filled 2014-04-20: qty 1

## 2014-04-20 MED ORDER — LACTATED RINGERS IV SOLN
INTRAVENOUS | Status: DC
  Administered 2014-04-21 – 2014-04-22 (×6): via INTRAVENOUS

## 2014-04-20 MED ORDER — LACTATED RINGERS IV SOLN
INTRAVENOUS | Status: DC
  Administered 2014-04-20 (×2): via INTRAVENOUS

## 2014-04-20 MED ORDER — TETANUS-DIPHTH-ACELL PERTUSSIS 5-2.5-18.5 LF-MCG/0.5 IM SUSP
0.5000 mL | Freq: Once | INTRAMUSCULAR | Status: DC
Start: 2014-04-21 — End: 2014-04-24
  Filled 2014-04-20: qty 0.5

## 2014-04-20 MED ORDER — KETOROLAC TROMETHAMINE 30 MG/ML IJ SOLN
30.0000 mg | Freq: Four times a day (QID) | INTRAMUSCULAR | Status: DC | PRN
Start: 2014-04-20 — End: 2014-04-20

## 2014-04-20 MED ORDER — OXYTOCIN 40 UNITS IN LACTATED RINGERS INFUSION - SIMPLE MED: 62.5000 mL/h | INTRAVENOUS | Status: AC

## 2014-04-20 MED ORDER — OXYTOCIN 10 UNIT/ML IJ SOLN
INTRAMUSCULAR | Status: AC
  Filled 2014-04-20: qty 4

## 2014-04-20 MED ORDER — PROMETHAZINE HCL 25 MG/ML IJ SOLN
6.2500 mg | INTRAMUSCULAR | Status: DC | PRN
  Administered 2014-04-20: 12.5 mg via INTRAVENOUS

## 2014-04-20 MED ORDER — GENTAMICIN SULFATE 40 MG/ML IJ SOLN
INTRAVENOUS | Status: AC
  Administered 2014-04-20: 234 mL via INTRAVENOUS
  Filled 2014-04-20: qty 11

## 2014-04-20 MED ORDER — MAGNESIUM SULFATE 40 G IN LACTATED RINGERS - SIMPLE
2.0000 g/h | INTRAVENOUS | Status: DC
  Administered 2014-04-20: 2 g/h via INTRAVENOUS
  Filled 2014-04-20: qty 500

## 2014-04-20 MED ORDER — PHENYLEPHRINE 8 MG IN D5W 100 ML (0.08MG/ML) PREMIX OPTIME
INJECTION | INTRAVENOUS | Status: DC | PRN
  Administered 2014-04-20: 60 ug/min via INTRAVENOUS

## 2014-04-20 MED ORDER — SCOPOLAMINE 1 MG/3DAYS TD PT72
1.0000 | MEDICATED_PATCH | Freq: Once | TRANSDERMAL | Status: DC
  Filled 2014-04-20: qty 1

## 2014-04-20 MED ORDER — KETOROLAC TROMETHAMINE 30 MG/ML IJ SOLN: 15.0000 mg | Freq: Once | INTRAMUSCULAR | Status: DC | PRN

## 2014-04-20 MED ORDER — WITCH HAZEL-GLYCERIN EX PADS: 1.0000 "application " | MEDICATED_PAD | CUTANEOUS | Status: DC | PRN

## 2014-04-20 MED ORDER — BUPIVACAINE LIPOSOME 1.3 % IJ SUSP
20.0000 mL | Freq: Once | INTRAMUSCULAR | Status: DC
  Filled 2014-04-20: qty 20

## 2014-04-20 MED ORDER — MEPERIDINE HCL 25 MG/ML IJ SOLN: 6.2500 mg | INTRAMUSCULAR | Status: DC | PRN

## 2014-04-20 MED ORDER — SIMETHICONE 80 MG PO CHEW
80.0000 mg | CHEWABLE_TABLET | ORAL | Status: DC
  Administered 2014-04-20 – 2014-04-23 (×4): 80 mg via ORAL
  Filled 2014-04-20 (×4): qty 1

## 2014-04-20 MED ORDER — IBUPROFEN 600 MG PO TABS
600.0000 mg | ORAL_TABLET | Freq: Four times a day (QID) | ORAL | Status: DC
  Administered 2014-04-20 – 2014-04-21 (×3): 600 mg via ORAL
  Filled 2014-04-20 (×3): qty 1

## 2014-04-20 MED ORDER — ONDANSETRON HCL 4 MG/2ML IJ SOLN: 4.0000 mg | Freq: Three times a day (TID) | INTRAMUSCULAR | Status: DC | PRN

## 2014-04-20 MED ORDER — DEXAMETHASONE SODIUM PHOSPHATE 10 MG/ML IJ SOLN
INTRAMUSCULAR | Status: DC | PRN
  Administered 2014-04-20: 10 mg via INTRAVENOUS

## 2014-04-20 MED ORDER — KETOROLAC TROMETHAMINE 30 MG/ML IJ SOLN: 30.0000 mg | Freq: Four times a day (QID) | INTRAMUSCULAR | Status: DC | PRN

## 2014-04-20 MED ORDER — SCOPOLAMINE 1 MG/3DAYS TD PT72
MEDICATED_PATCH | TRANSDERMAL | Status: DC | PRN
  Administered 2014-04-20: 1 via TRANSDERMAL

## 2014-04-20 MED ORDER — HYDROMORPHONE HCL 2 MG PO TABS
1.0000 mg | ORAL_TABLET | ORAL | Status: DC | PRN
Start: 2014-04-20 — End: 2014-04-24
  Administered 2014-04-22 (×2): 1 mg via ORAL
  Filled 2014-04-20 (×2): qty 1

## 2014-04-20 MED ORDER — LACTATED RINGERS IV BOLUS (SEPSIS)
1000.0000 mL | Freq: Once | INTRAVENOUS | Status: AC
  Administered 2014-04-20: 1000 mL via INTRAVENOUS

## 2014-04-20 MED ORDER — SIMETHICONE 80 MG PO CHEW: 80.0000 mg | CHEWABLE_TABLET | ORAL | Status: DC | PRN

## 2014-04-20 SURGICAL SUPPLY — 36 items
BENZOIN TINCTURE PRP APPL 2/3 (GAUZE/BANDAGES/DRESSINGS) ×2 IMPLANT
CLAMP CORD UMBIL (MISCELLANEOUS) IMPLANT
CLOSURE STERI STRIP 1/2 X4 (GAUZE/BANDAGES/DRESSINGS) ×2 IMPLANT
CLOTH BEACON ORANGE TIMEOUT ST (SAFETY) ×2 IMPLANT
CONTAINER PREFILL 10% NBF 15ML (MISCELLANEOUS) IMPLANT
DRAPE SHEET LG 3/4 BI-LAMINATE (DRAPES) IMPLANT
DRSG OPSITE POSTOP 4X10 (GAUZE/BANDAGES/DRESSINGS) ×2 IMPLANT
DRSG PAD ABDOMINAL 8X10 ST (GAUZE/BANDAGES/DRESSINGS) ×2 IMPLANT
DURAPREP 26ML APPLICATOR (WOUND CARE) ×2 IMPLANT
ELECT REM PT RETURN 9FT ADLT (ELECTROSURGICAL) ×2
ELECTRODE REM PT RTRN 9FT ADLT (ELECTROSURGICAL) ×1 IMPLANT
EXTRACTOR VACUUM M CUP 4 TUBE (SUCTIONS) IMPLANT
GLOVE BIO SURGEON STRL SZ7.5 (GLOVE) ×2 IMPLANT
GOWN STRL REUS W/TWL LRG LVL3 (GOWN DISPOSABLE) ×4 IMPLANT
KIT ABG SYR 3ML LUER SLIP (SYRINGE) ×2 IMPLANT
LIQUID BAND (GAUZE/BANDAGES/DRESSINGS) IMPLANT
NEEDLE HYPO 21X1.5 SAFETY (NEEDLE) ×2 IMPLANT
NEEDLE HYPO 25X5/8 SAFETYGLIDE (NEEDLE) ×2 IMPLANT
NS IRRIG 1000ML POUR BTL (IV SOLUTION) ×2 IMPLANT
PACK C SECTION WH (CUSTOM PROCEDURE TRAY) ×2 IMPLANT
PAD OB MATERNITY 4.3X12.25 (PERSONAL CARE ITEMS) ×2 IMPLANT
SPONGE GAUZE 4X4 12PLY STER LF (GAUZE/BANDAGES/DRESSINGS) ×2 IMPLANT
STAPLER VISISTAT 35W (STAPLE) IMPLANT
STRIP CLOSURE SKIN 1/2X4 (GAUZE/BANDAGES/DRESSINGS) IMPLANT
SUT MNCRL 0 VIOLET CTX 36 (SUTURE) ×4 IMPLANT
SUT MONOCRYL 0 CTX 36 (SUTURE) ×4
SUT PDS AB 0 CTX 60 (SUTURE) ×2 IMPLANT
SUT PLAIN 0 NONE (SUTURE) IMPLANT
SUT PLAIN 2 0 (SUTURE)
SUT PLAIN 2 0 XLH (SUTURE) IMPLANT
SUT PLAIN ABS 2-0 CT1 27XMFL (SUTURE) IMPLANT
SUT VIC AB 4-0 KS 27 (SUTURE) ×2 IMPLANT
SYR 20CC LL (SYRINGE) ×2 IMPLANT
TOWEL OR 17X24 6PK STRL BLUE (TOWEL DISPOSABLE) ×2 IMPLANT
TRAY FOLEY CATH 14FR (SET/KITS/TRAYS/PACK) ×2 IMPLANT
WATER STERILE IRR 1000ML POUR (IV SOLUTION) ×2 IMPLANT

## 2014-04-20 NOTE — Anesthesia Procedure Notes (Addendum)
Spinal Patient location during procedure: OR Start time: 04/20/2014 7:40 PM Staffing Anesthesiologist: Aldrick Derrig A. Performed by: anesthesiologist  Preanesthetic Checklist Completed: patient identified, site marked, surgical consent, pre-op evaluation, timeout performed, IV checked, risks and benefits discussed and monitors and equipment checked Spinal Block Patient position: sitting Prep: site prepped and draped and DuraPrep Patient monitoring: heart rate, cardiac monitor, continuous pulse ox and blood pressure Approach: midline Location: L3-4 Injection technique: single-shot Needle Needle type: Sprotte  Needle gauge: 24 G Needle length: 9 cm Needle insertion depth: 7 cm Assessment Sensory level: T4 Additional Notes Patient tolerated procedure well. Adequate sensory level.

## 2014-04-20 NOTE — Transfer of Care (Signed)
Immediate Anesthesia Transfer of Care Note  Patient: Kim DownerKimilee P Finger  Procedure(s) Performed: Procedure(s): CESAREAN SECTION (N/A)  Patient Location: PACU  Anesthesia Type:Spinal  Level of Consciousness: awake  Airway & Oxygen Therapy: Patient Spontanous Breathing  Post-op Assessment: Report given to PACU RN and Post -op Vital signs reviewed and stable  Post vital signs: stable  Complications: No apparent anesthesia complications

## 2014-04-20 NOTE — Anesthesia Preprocedure Evaluation (Addendum)
Anesthesia Evaluation  Patient identified by MRN, date of birth, ID band Patient awake    Reviewed: Allergy & Precautions, H&P , Patient's Chart, lab work & pertinent test results  Airway Mallampati: II  TM Distance: >3 FB Neck ROM: full    Dental   Pulmonary  breath sounds clear to auscultation        Cardiovascular hypertension, Rhythm:regular Rate:Normal     Neuro/Psych  Headaches,    GI/Hepatic   Endo/Other  Morbid obesity  Renal/GU      Musculoskeletal   Abdominal   Peds  Hematology   Anesthesia Other Findings No mitral stenosis on echo 9/1 (mild regurg) other valves normal.  Septal defects repaired  Reproductive/Obstetrics (+) Pregnancy                           Anesthesia Physical Anesthesia Plan  ASA: III and emergent  Anesthesia Plan: Spinal   Post-op Pain Management:    Induction:   Airway Management Planned:   Additional Equipment:   Intra-op Plan:   Post-operative Plan:   Informed Consent: I have reviewed the patients History and Physical, chart, labs and discussed the procedure including the risks, benefits and alternatives for the proposed anesthesia with the patient or authorized representative who has indicated his/her understanding and acceptance.     Plan Discussed with:   Anesthesia Plan Comments:        Anesthesia Quick Evaluation

## 2014-04-20 NOTE — H&P (Signed)
Kim Sharp is a 28 y.o. female presenting for C/O headache. [facility] for elevated BP. Was noted to have elevated LFT, protein/creatinine ratio of .25, elevated doppler flow on fetus with normal growth and labile BP. Today in office BP is 170s/106 and patient has headache. Maternal Medical History:  Fetal activity: Perceived fetal activity is normal.      OB History    Gravida Para Term Preterm AB TAB SAB Ectopic Multiple Living   2 1 1       1      Past Medical History  Diagnosis Date   Atrial septal defect and mitral stenosis    VSD (ventricular septal defect)    Cystic fibrosis carrier    H/O varicella    Headache(784.0)    Obesity    Pregnancy induced hypertension    Past Surgical History  Procedure Laterality Date   Cesarean section  01/26/2012    Procedure: CESAREAN SECTION;  Surgeon: Meriel Picaichard M Holland, MD;  Location: [facility];  Service: Obstetrics;  Laterality: N/A;  Primary Cesarean Section Delivery Baby Boy @ [phone], Apgar 9/9    Cardiac surgery      asd and vsd repair   Family History: family history includes Heart disease in her paternal grandmother; Hypertension in her father; Stroke in her father. Social History:  reports that she has never smoked. She has never used smokeless tobacco. She reports that she does not drink alcohol or use illicit drugs.   Prenatal Transfer Tool  Maternal Diabetes: No Genetic Screening: Normal Maternal Ultrasounds/Referrals: Normal Fetal Ultrasounds or other Referrals:  None Maternal Substance Abuse:  No Significant Maternal Medications:  None Significant Maternal Lab Results:  None Other Comments:  None  Review of Systems  Eyes: Negative for blurred vision.  Gastrointestinal: Negative for abdominal pain.  Neurological: Positive for headaches.      Blood pressure 149/93, pulse 89, temperature 98.1 F (36.7 C), temperature source Oral, resp. rate 16, last menstrual period  08/13/2013, SpO2 97 %.   Fetal Exam Fetal State Assessment: Category I - tracings are normal.     Physical Exam  Cardiovascular: Normal rate and regular rhythm.   Respiratory: Effort normal and breath sounds normal.  GI: Soft. There is no tenderness.  Neurological: She has normal reflexes.    Prenatal labs: ABO, Rh: --/--/B POS (12/11 1650) Antibody: NEG (12/11 1650) Rubella:   RPR:    HBsAg:    HIV:    GBS:     Results for orders placed or performed during the hospital encounter of 04/20/14 (from the past 24 hour(s))  CBC     Status: None   Collection Time: 04/20/14  3:29 PM  Result Value Ref Range   WBC 10.0 4.0 - 10.5 K/uL   RBC 4.42 3.87 - 5.11 MIL/uL   Hemoglobin 13.6 12.0 - 15.0 g/dL   HCT 21.340.6 08.636.0 - 57.846.0 %   MCV 91.9 78.0 - 100.0 fL   MCH 30.8 26.0 - 34.0 pg   MCHC 33.5 30.0 - 36.0 g/dL   RDW 46.914.0 62.911.5 - 52.815.5 %   Platelets 244 150 - 400 K/uL  Comprehensive metabolic panel     Status: Abnormal   Collection Time: 04/20/14  3:29 PM  Result Value Ref Range   Sodium 136 (L) 137 - 147 mEq/L   Potassium 4.5 3.7 - 5.3 mEq/L   Chloride 101 96 - 112 mEq/L   CO2 22 19 - 32 mEq/L  Glucose, Bld 101 (H) 70 - 99 mg/dL   BUN 13 6 - 23 mg/dL   Creatinine, Ser 8.290.69 0.50 - 1.10 mg/dL   Calcium 9.2 8.4 - 56.210.5 mg/dL   Total Protein 6.6 6.0 - 8.3 g/dL   Albumin 2.5 (L) 3.5 - 5.2 g/dL   AST 56 (H) 0 - 37 U/L   ALT 41 (H) 0 - 35 U/L   Alkaline Phosphatase 94 39 - 117 U/L   Total Bilirubin <0.2 (L) 0.3 - 1.2 mg/dL   GFR calc non Af Amer >90 >90 mL/min   GFR calc Af Amer >90 >90 mL/min   Anion gap 13 5 - 15  Uric acid     Status: None   Collection Time: 04/20/14  3:29 PM  Result Value Ref Range   Uric Acid, Serum 4.7 2.4 - 7.0 mg/dL  Protein / creatinine ratio, urine     Status: Abnormal   Collection Time: 04/20/14  4:05 PM  Result Value Ref Range   Creatinine, Urine 85.15 mg/dL   Total Protein, Urine 30.3 mg/dL   Protein Creatinine Ratio 0.36 (H) 0.00 - 0.15     FHT reactive  Assessment/Plan: 28 yo G2P1 at 5035 5/7 weeks with CHTN and superimposed preeclampsia on basis of progressive elevation of urine protein, elevated LFT and CNS changes Chart carefully reviewed with Dr Sherrie Georgeecker of MFM who recommends delivery D/W patient above-D/W cesarean section and risks including infection, organ damage, bleeding/transfusion-HIV/Hep, DVT/PE, pneumonia, return to OR, pelvic/abdominal pain D/W patient fetus is pre mature and there is a risk of pulmonary immaturity and other complications of premature delivery Patient and husband state they understand and agree with plan of delivery   Kim Sharp II,Ruperto Kiernan E 04/20/2014, 6:12 PM

## 2014-04-20 NOTE — Anesthesia Postprocedure Evaluation (Signed)
°  Anesthesia Post-op Note  Patient: Marion DownerKimilee P Reisch  Procedure(s) Performed: Procedure(s): CESAREAN SECTION (N/A)  Patient Location: PACU  Anesthesia Type:Spinal  Level of Consciousness: awake, alert  and oriented  Airway and Oxygen Therapy: Patient Spontanous Breathing  Post-op Pain: none  Post-op Assessment: Post-op Vital signs reviewed, Patient's Cardiovascular Status Stable, Respiratory Function Stable, Patent Airway, No signs of Nausea or vomiting, Pain level controlled, No headache and No backache  Post-op Vital Signs: Reviewed and stable  Last Vitals:  Filed Vitals:   04/20/14 2100  BP: 122/65  Pulse: 74  Temp:   Resp: 20    Complications: No apparent anesthesia complications

## 2014-04-20 NOTE — Progress Notes (Signed)
Chart indicates that I started and IV on this patient, I did not and do not know who charted that information.

## 2014-04-20 NOTE — Brief Op Note (Signed)
04/20/2014  9:10 PM  PATIENT:  Marion DownerKimilee P Bagsby  28 y.o. female  PRE-OPERATIVE DIAGNOSIS:  Preeclampsia  POST-OPERATIVE DIAGNOSIS:  Repeat Cesearan Section   PROCEDURE:  Procedure(s): CESAREAN SECTION (N/A)  SURGEON:  Surgeon(s) and Role:    * Roselle LocusJames E Kentavious Michele II, MD - Primary  PHYSICIAN ASSISTANT:   ASSISTANTS: none   ANESTHESIA:   spinal  EBL:  Total I/O In: 1200 [I.V.:1200] Out: 950 [Urine:250; Blood:700]  BLOOD ADMINISTERED:none  DRAINS: Urinary Catheter (Foley)   LOCAL MEDICATIONS USED:  OTHER Exparel 20 ml in 20 ml of saline  SPECIMEN:  Source of Specimen:  placenta  DISPOSITION OF SPECIMEN:  PATHOLOGY  COUNTS:  YES  TOURNIQUET:  * No tourniquets in log *  DICTATION: .Other Dictation: Dictation Number [id number]  PLAN OF CARE: Admit to inpatient   PATIENT DISPOSITION:  PACU - hemodynamically stable.   Delay start of Pharmacological VTE agent (>24hrs) due to surgical blood loss or risk of bleeding: not applicable

## 2014-04-20 NOTE — MAU Provider Note (Signed)
History     CSN: [id number]  Arrival date and time: 04/20/14 1507   First Provider Initiated Contact with Patient 04/20/14 1639      Chief Complaint  Patient presents with   Hypertension   HPI THis is a 28 y.o. female at 2390w5d who presents for evaluation of hypertension in pregnancy. Has had headaches but no visual changes.  Does have edema. No abdominal pain. Last visit in hospital showed persistent elevated liver enzymes.   OB History    Gravida Para Term Preterm AB TAB SAB Ectopic Multiple Living   2 1 1       1       Past Medical History  Diagnosis Date   Atrial septal defect and mitral stenosis    VSD (ventricular septal defect)    Cystic fibrosis carrier    H/O varicella    Headache(784.0)    Obesity    Pregnancy induced hypertension     Past Surgical History  Procedure Laterality Date   Cesarean section  01/26/2012    Procedure: CESAREAN SECTION;  Surgeon: Meriel Picaichard M Holland, MD;  Location: [facility];  Service: Obstetrics;  Laterality: N/A;  Primary Cesarean Section Delivery Baby Boy @ [phone], Apgar 9/9    Cardiac surgery      asd and vsd repair    Family History  Problem Relation Age of Onset   Hypertension Father    Stroke Father    Heart disease Paternal Grandmother     History  Substance Use Topics   Smoking status: Never Smoker    Smokeless tobacco: Never Used   Alcohol Use: No    Allergies:  Allergies  Allergen Reactions   Codeine Nausea Only and Rash    Causes nausea.   Penicillins Nausea Only    Causes nausea. Causes nausea.   Butorphanol Tartrate Other (See Comments)   Stadol [Butorphanol] Other (See Comments)    Caused hallucinations.    Prescriptions prior to admission  Medication Sig Dispense Refill Last Dose   acetaminophen (TYLENOL) 500 MG tablet Take 500-1,000 mg by mouth every 6 (six) hours as needed for mild pain.   Past Week at Unknown time   calcium carbonate (TUMS EX) 750 MG chewable tablet Chew 1 tablet  by mouth 3 (three) times daily as needed for heartburn.   Past Week at Unknown time   famotidine (PEPCID) 20 MG tablet Take 20 mg by mouth daily.   Past Week at Unknown time   Prenatal Vit-Fe Fumarate-FA (PRENATAL MULTIVITAMIN) TABS tablet Take 1 tablet by mouth at bedtime.   04/19/2014 at Unknown time    Review of Systems  Constitutional: Negative for fever, chills and malaise/fatigue.  Eyes: Negative for blurred vision and double vision.  Cardiovascular: Positive for leg swelling. Negative for chest pain.  Gastrointestinal: Negative for nausea, vomiting and abdominal pain.  Neurological: Positive for headaches. Negative for dizziness.   Physical Exam   Blood pressure 155/92, pulse 95, temperature 98.1 F (36.7 C), temperature source Oral, resp. rate 16, last menstrual period 08/13/2013, SpO2 98 %.  Physical Exam  Constitutional: She is oriented to person, place, and time. She appears well-developed and well-nourished. No distress.  HENT:  Head: Normocephalic.  Cardiovascular: Normal rate, regular rhythm and normal heart sounds.  Exam reveals no gallop and no friction rub.   No murmur heard. Respiratory: Effort normal and breath sounds normal. No respiratory distress. She has no wheezes. She has no rales. She exhibits no tenderness.  GI: Soft. She exhibits  no distension. There is no tenderness. There is no rebound and no guarding.  Musculoskeletal: Normal range of motion. She exhibits edema.  Neurological: She is alert and oriented to person, place, and time. She displays abnormal reflex (3+). She exhibits abnormal muscle tone (1 beat clonus).  Skin: Skin is warm and dry.  Psychiatric: She has a normal mood and affect.   Filed Vitals:   04/20/14 1710 04/20/14 1712 04/20/14 1715 04/20/14 1720  BP:  149/93    Pulse: 94 94 88 89  Temp:      TempSrc:      Resp:      SpO2: 97%  98% 97%  155/100 mmHg  163/128 mmHg 151/84 mmHg 151/84 mmHg  MAU Course   Procedures  MDM Results for orders placed or performed during the hospital encounter of 04/20/14 (from the past 24 hour(s))  CBC     Status: None   Collection Time: 04/20/14  3:29 PM  Result Value Ref Range   WBC 10.0 4.0 - 10.5 K/uL   RBC 4.42 3.87 - 5.11 MIL/uL   Hemoglobin 13.6 12.0 - 15.0 g/dL   HCT 78.440.6 69.636.0 - 29.546.0 %   MCV 91.9 78.0 - 100.0 fL   MCH 30.8 26.0 - 34.0 pg   MCHC 33.5 30.0 - 36.0 g/dL   RDW 28.414.0 13.211.5 - 44.015.5 %   Platelets 244 150 - 400 K/uL  Comprehensive metabolic panel     Status: Abnormal   Collection Time: 04/20/14  3:29 PM  Result Value Ref Range   Sodium 136 (L) 137 - 147 mEq/L   Potassium 4.5 3.7 - 5.3 mEq/L   Chloride 101 96 - 112 mEq/L   CO2 22 19 - 32 mEq/L   Glucose, Bld 101 (H) 70 - 99 mg/dL   BUN 13 6 - 23 mg/dL   Creatinine, Ser 1.020.69 0.50 - 1.10 mg/dL   Calcium 9.2 8.4 - 72.510.5 mg/dL   Total Protein 6.6 6.0 - 8.3 g/dL   Albumin 2.5 (L) 3.5 - 5.2 g/dL   AST 56 (H) 0 - 37 U/L   ALT 41 (H) 0 - 35 U/L   Alkaline Phosphatase 94 39 - 117 U/L   Total Bilirubin <0.2 (L) 0.3 - 1.2 mg/dL   GFR calc non Af Amer >90 >90 mL/min   GFR calc Af Amer >90 >90 mL/min   Anion gap 13 5 - 15  Uric acid     Status: None   Collection Time: 04/20/14  3:29 PM  Result Value Ref Range   Uric Acid, Serum 4.7 2.4 - 7.0 mg/dL  Protein / creatinine ratio, urine     Status: Abnormal   Collection Time: 04/20/14  4:05 PM  Result Value Ref Range   Creatinine, Urine 85.15 mg/dL   Total Protein, Urine 30.3 mg/dL   Protein Creatinine Ratio 0.36 (H) 0.00 - 0.15   Last PR/CR Ratio was 0.23  Assessment and Plan  A:  SIUP at 4537w5d       Preeclampsia  P:  Dr Henderson Cloudomblin consulted with MFM       Admit  [facility],Tre Sanker 04/20/2014, 5:17 PM

## 2014-04-20 NOTE — MAU Note (Signed)
Kris HartmannNicole Jones, RN in nursery given report at this time.

## 2014-04-20 NOTE — MAU Note (Signed)
Pt states sent from MD office for hypertension eval. BP in office 170/100. Has had intermittent headache. No dizziness, blurred vision.

## 2014-04-21 ENCOUNTER — Inpatient Hospital Stay (HOSPITAL_COMMUNITY): Payer: 59

## 2014-04-21 ENCOUNTER — Encounter (HOSPITAL_COMMUNITY): Payer: Self-pay | Admitting: *Deleted

## 2014-04-21 ENCOUNTER — Encounter (HOSPITAL_COMMUNITY): Payer: Self-pay | Admitting: Anesthesiology

## 2014-04-21 DIAGNOSIS — O1490 Unspecified pre-eclampsia, unspecified trimester: Secondary | ICD-10-CM | POA: Diagnosis present

## 2014-04-21 LAB — COMPREHENSIVE METABOLIC PANEL
ALK PHOS: 77 U/L (ref 39–117)
ALK PHOS: 62 U/L (ref 39–117)
ALT: 38 U/L — ABNORMAL HIGH (ref 0–35)
ALT: 31 U/L (ref 0–35)
ANION GAP: 14 (ref 5–15)
AST: 50 U/L — AB (ref 0–37)
AST: 38 U/L — ABNORMAL HIGH (ref 0–37)
Albumin: 2.3 g/dL — ABNORMAL LOW (ref 3.5–5.2)
Albumin: 2 g/dL — ABNORMAL LOW (ref 3.5–5.2)
Anion gap: 14 (ref 5–15)
BILIRUBIN TOTAL: 0.2 mg/dL — AB (ref 0.3–1.2)
BUN: 17 mg/dL (ref 6–23)
BUN: 23 mg/dL (ref 6–23)
CALCIUM: 8 mg/dL — AB (ref 8.4–10.5)
CHLORIDE: 99 meq/L (ref 96–112)
CO2: 18 mEq/L — ABNORMAL LOW (ref 19–32)
CO2: 20 mEq/L (ref 19–32)
CREATININE: 1.36 mg/dL — AB (ref 0.50–1.10)
Calcium: 7.7 mg/dL — ABNORMAL LOW (ref 8.4–10.5)
Chloride: 102 mEq/L (ref 96–112)
Creatinine, Ser: 0.85 mg/dL (ref 0.50–1.10)
GFR calc Af Amer: 60 mL/min — ABNORMAL LOW (ref >90)
GFR calc non Af Amer: >90 mL/min (ref >90)
GFR calc non Af Amer: 52 mL/min — ABNORMAL LOW (ref >90)
GLUCOSE: 150 mg/dL — AB (ref 70–99)
GLUCOSE: 112 mg/dL — AB (ref 70–99)
POTASSIUM: 5.4 meq/L — AB (ref 3.7–5.3)
POTASSIUM: 4.5 meq/L (ref 3.7–5.3)
SODIUM: 134 meq/L — AB (ref 137–147)
Sodium: 133 mEq/L — ABNORMAL LOW (ref 137–147)
TOTAL PROTEIN: 5.9 g/dL — AB (ref 6.0–8.3)
Total Bilirubin: <0.2 mg/dL — ABNORMAL LOW (ref 0.3–1.2)
Total Protein: 5 g/dL — ABNORMAL LOW (ref 6.0–8.3)

## 2014-04-21 LAB — CBC
HCT: 35.6 % — ABNORMAL LOW (ref 36.0–46.0)
HCT: 31.2 % — ABNORMAL LOW (ref 36.0–46.0)
HCT: 25.9 % — ABNORMAL LOW (ref 36.0–46.0)
HEMOGLOBIN: 11.8 g/dL — AB (ref 12.0–15.0)
HEMOGLOBIN: 10.2 g/dL — AB (ref 12.0–15.0)
HEMOGLOBIN: 8.7 g/dL — AB (ref 12.0–15.0)
MCH: 30.6 pg (ref 26.0–34.0)
MCH: 30.4 pg (ref 26.0–34.0)
MCH: 31.1 pg (ref 26.0–34.0)
MCHC: 33.1 g/dL (ref 30.0–36.0)
MCHC: 32.7 g/dL (ref 30.0–36.0)
MCHC: 33.6 g/dL (ref 30.0–36.0)
MCV: 92.5 fL (ref 78.0–100.0)
MCV: 93.1 fL (ref 78.0–100.0)
MCV: 92.5 fL (ref 78.0–100.0)
PLATELETS: 283 10*3/uL (ref 150–400)
PLATELETS: 286 10*3/uL (ref 150–400)
Platelets: 234 10*3/uL (ref 150–400)
RBC: 3.85 MIL/uL — ABNORMAL LOW (ref 3.87–5.11)
RBC: 3.35 MIL/uL — ABNORMAL LOW (ref 3.87–5.11)
RBC: 2.8 MIL/uL — ABNORMAL LOW (ref 3.87–5.11)
RDW: 14.3 % (ref 11.5–15.5)
RDW: 14.5 % (ref 11.5–15.5)
RDW: 14.5 % (ref 11.5–15.5)
WBC: 18.2 10*3/uL — ABNORMAL HIGH (ref 4.0–10.5)
WBC: 16.3 10*3/uL — AB (ref 4.0–10.5)
WBC: 13.3 10*3/uL — ABNORMAL HIGH (ref 4.0–10.5)

## 2014-04-21 LAB — URIC ACID: Uric Acid, Serum: 5.9 mg/dL (ref 2.4–7.0)

## 2014-04-21 LAB — RPR

## 2014-04-21 MED ORDER — LACTATED RINGERS IV BOLUS (SEPSIS)
500.0000 mL | Freq: Once | INTRAVENOUS | Status: AC
  Administered 2014-04-21: 500 mL via INTRAVENOUS

## 2014-04-21 NOTE — Lactation Note (Signed)
This note was copied from the chart of Kim Sharp. Lactation Consultation Note  Initial visit made. Breastfeeding consultation services and support information given and reviewed with patient.  Reviewed late preterm feeding norms and challenges with parents.  LPT handout given and reviewed.  Instructed on plan to offer breast when baby cues followed by formula supplementation per bottle per LPT guidelines.  Mom instructed to pump/hand express every 3 hours and give any EBM to baby.  Parents agree on plan of care.  Encouraged to call with concerns/questions.  Patient Name: Kim Jerelyn CharlesKimilee Gilardi ZOXWR'UToday's Date: 04/21/2014 Reason for consult: Initial assessment;Infant < 6lbs;Late preterm infant   Maternal Data    Feeding Feeding Type: Breast Fed Length of feed: 0 min  LATCH Score/Interventions Latch: Grasps breast easily, tongue down, lips flanged, rhythmical sucking. Intervention(s): Skin to skin Intervention(s): Adjust position;Assist with latch;Breast massage;Breast compression  Audible Swallowing: None Intervention(s): Hand expression;Skin to skin Intervention(s): Skin to skin  Type of Nipple: Everted at rest and after stimulation  Comfort (Breast/Nipple): Soft / non-tender     Hold (Positioning): Assistance needed to correctly position infant at breast and maintain latch. Intervention(s): Breastfeeding basics reviewed;Support Pillows  LATCH Score: 7  Lactation Tools Discussed/Used Pump Review: Setup, frequency, and cleaning;Milk Storage Initiated by:: Lucky RathkeLMOULDEN RN, IBCLC Date initiated:: 04/21/14   Consult Status Consult Status: Follow-up Date: 04/22/14 Follow-up type: In-patient    Huston FoleyMOULDEN, Jaysiah Marchetta S 04/21/2014, 10:39 AM

## 2014-04-21 NOTE — Progress Notes (Signed)
UR chart review completed. ° °

## 2014-04-21 NOTE — Progress Notes (Signed)
Pt sitting at bedside without complaints.  No HA, SOB, or CP.  No dizziness or lightheadedness. UOP was 10-15cc/hr this afternoon but approx 200-250cc in foley bag now - clear & not concentrated  AF, VSS Gen - NAD Abd - fundus firm @ umb.  Soft, NT/ND no r/g.  Bandage dry Ext - NT Lochia scant  Labs reviewed.  Hgb 8.7, Cr 1.3  A/P:  POD #1 s/p c-section for pre-eclampsia No evidence of postop bleeding despite VS & lab values UOP seems to have recovered over last 1-2 hrs D/c motrin Will rpt labs in am

## 2014-04-21 NOTE — Progress Notes (Signed)
Pt evaluated.  Denies lightheadedness or dizziness.  VB scant.  Pain controlled.  BP 90s/60s UOP 25-50cc/hr Gen - NAD Abd - soft, NT/ND.  Fundus firm umb+1 Ext - NT PV - scant lochia - pad unchanged from this am  Hgb 10.2  A/P:  Pre-eclampsia Magnesium off d/t hypotension Rpt IVF bolus Rpt cbc at 1630

## 2014-04-21 NOTE — Progress Notes (Addendum)
Subjective: Postpartum Day 1: Cesarean Delivery Patient reports tolerating PO. Pt with episode of hypotension and VB overnight.  RN estimates 500cc w/ clots.  Pt now feeling better and lochia minimal.  Pt denies lightheadedness and dizziness.  Hgb 11 this am.  Reports HA resolved.  No visual changes or pain.     Objective: Vital signs in last 24 hours: Temp:  [97.9 F (36.6 C)-98.4 F (36.9 C)] 98.1 F (36.7 C) (12/16 0015) Pulse Rate:  [74-108] 101 (12/16 0700) Resp:  [16-28] 18 (12/16 0532) BP: (76-163)/(41-128) 94/47 mmHg (12/16 0700) SpO2:  [91 %-100 %] 96 % (12/16 0700) Weight:  [124.921 kg (275 lb 6.4 oz)] 124.921 kg (275 lb 6.4 oz) (12/15 2233)  UOP 30cc/hr  Physical Exam:  General: alert and cooperative Lochia: appropriate Uterine Fundus: firm Incision: healing well, no significant drainage DVT Evaluation: No evidence of DVT seen on physical exam.   Recent Labs  04/20/14 1529 04/21/14 0448  HGB 13.6 11.8*  HCT 40.6 35.6*    Assessment/Plan: Status post Cesarean section. Doing well postoperatively.  Will d/c mag this am as pts BP is low Recheck CBC at noon Watch BP closely, continue IVF Recheck labs in am - LFTs improving  Kim Sharp 04/21/2014, 8:05 AM

## 2014-04-21 NOTE — Progress Notes (Signed)
Call from RN  Pt with BP 70-80/40-50s, HR 101 UOP 25cc x 2 hrs  VB minimal and pt without lightheadedness or dizziness  A/P:  Will give 500cc bolus & check CBC

## 2014-04-21 NOTE — Anesthesia Postprocedure Evaluation (Signed)
Anesthesia Post Note  Patient: Marion DownerKimilee P Sibal  Procedure(s) Performed: Procedure(s) (LRB): CESAREAN SECTION (N/A)  Anesthesia type: Spinal  Patient location: Mother/Baby  Post pain: Pain level controlled  Post assessment: Post-op Vital signs reviewed  Last Vitals:  Filed Vitals:   04/21/14 0800  BP:   Pulse:   Temp: 37.1 C  Resp:     Post vital signs: Reviewed  Level of consciousness: awake  Complications: No apparent anesthesia complications

## 2014-04-21 NOTE — Op Note (Signed)
NAMJerelyn Charles:  Batres, Chee               ACCOUNT NO.:  [id number]  MEDICAL RECORD NO.:  [id number]  LOCATION:  9373                          FACILITY:  [facility]  PHYSICIAN:  Guy SandiferJames E. Henderson Cloudomblin, M.D. DATE OF BIRTH:  [birth year]  DATE OF PROCEDURE:  04/20/2014 DATE OF DISCHARGE:                              OPERATIVE REPORT   PREOPERATIVE DIAGNOSIS: 1. Intrauterine pregnancy at 35-5/7th weeks. 2. Preeclampsia superimposed on chronic hypertension. 3. Previous cesarean section, desires repeat.  POSTOPERATIVE DIAGNOSIS: 1. Intrauterine pregnancy at 35-5/7th weeks. 2. Preeclampsia superimposed on chronic hypertension. 3. Previous cesarean section, desires repeat.  SURGEON:  Guy SandiferJames E. Henderson Cloudomblin, M.D.  ANESTHESIA:  Spinal; Angelica PouMichael A Foster, MD  ESTIMATED BLOOD LOSS:  500 mL.  FINDINGS:  Viable female infant.  Apgars, arterial cord pH, and birth weight pending.  SPECIMENS:  Placenta to Pathology.  INDICATIONS AND CONSENT:  This patient is a 28 year old, G2, 54P1, 35- 5/7th with preeclampsia.  Details are dictated in History and Physical. She is desirous of a repeat cesarean section.  Potential risks and complications were reviewed preoperatively including, but not limited to, infection, organ damage, bleeding requiring transfusion of blood products with HIV and hepatitis acquisition, DVT, PE, pneumonia, breakdown of the incision, return to the operating room, pelvic pain, abdominal pain.  The patient states she understands.  All questions were answered, and she agrees to the procedure.  Consent was signed on the chart.  DESCRIPTION OF PROCEDURE:  The patient was taken to the operating room, where spinal anesthetic was placed per Dr. Malen GauzeFoster.  She was placed in dorsal supine position with a 15-degree left lateral wedge.  Foley catheter was placed and the bladder was drained, and she was prepped and draped in a sterile fashion per [facility] protocol.  Time-out undertaken.  After testing for  adequate spinal anesthesia, skin was entered through the Pfannenstiel scar.  Dissection was carried out in layers of the peritoneum.  Peritoneum was taken down superiorly and inferiorly.  Vesicouterine peritoneum was taken down cephalad laterally. Bladder flap was developed, and the bladder blade was placed.  Uterus was incised in a low transverse manner, and the uterine cavity was entered bluntly with a hemostat.  The uterine incision was extended cephalad laterally with fingers.  Clear fluid was noted.  Vertex delivered in a double nuchal cord that was loose was reduced.  The baby was then delivered, good cry and tone was noted.  Cord was clamped and cut, and the baby was handed to awaiting pediatrics team.  Placenta was manually delivered and sent to Pathology.  Uterine cavity was clean. Uterus was closed in 2 running locking imbricating layers of 0 Monocryl suture which achieves good hemostasis.  Irrigation was carried out. Anterior peritoneum was closed running fashion with 0 Monocryl suture which was used to reapproximate the pyramidalis muscle in midline. Anterior rectus fascia was closed in a running fashion with a 0 looped PDS suture.  20 mL of Exparel diluted in 20 mL of saline, it was then injected both subfascial layers subcutaneously.  Subcutaneous layers closed with interrupted plain suture, and the skin was closed with a subcuticular Vicryl on a Keith needle.  Steri-Strips were applied. Pressure  dressing was applied.  All counts were correct, and the patient was transferred to recovery room in stable condition.     Guy SandiferJames E. Henderson Cloudomblin, M.D.     JET/MEDQ  D:  04/20/2014  T:  04/21/2014  Job:  [id number]

## 2014-04-21 NOTE — Progress Notes (Signed)
[id number] - Pt. up on side of bed, dangled and then stood up at bedside.  Large amt lochia, dark red bleeding when standing and  subsequent drop in BP (see VS record).  Back to bed, 500ml bolus LR with gradual improvement in BP.  Pt  symptomatic with c/o being lightheaded, dizzy briefly.  Pulse strong, color good and mucus membranes pink.  Report to Dr. Henderson Cloudomblin, labs drawn with hgb 11.8.  Uterus firm, even at umbilicus,   approx 500ml blood loss.  Gradual improvement in BP with BP 93/57, pulse 105  at 0515.

## 2014-04-22 ENCOUNTER — Ambulatory Visit (HOSPITAL_COMMUNITY): Payer: 59

## 2014-04-22 ENCOUNTER — Other Ambulatory Visit (HOSPITAL_COMMUNITY): Payer: Self-pay

## 2014-04-22 LAB — CBC
HCT: 23.3 % — ABNORMAL LOW (ref 36.0–46.0)
Hemoglobin: 7.9 g/dL — ABNORMAL LOW (ref 12.0–15.0)
MCH: 31.3 pg (ref 26.0–34.0)
MCHC: 33.9 g/dL (ref 30.0–36.0)
MCV: 92.5 fL (ref 78.0–100.0)
PLATELETS: 205 10*3/uL (ref 150–400)
RBC: 2.52 MIL/uL — ABNORMAL LOW (ref 3.87–5.11)
RDW: 14.8 % (ref 11.5–15.5)
WBC: 10 10*3/uL (ref 4.0–10.5)

## 2014-04-22 LAB — COMPREHENSIVE METABOLIC PANEL
ALBUMIN: 2 g/dL — AB (ref 3.5–5.2)
ALT: 32 U/L (ref 0–35)
AST: 43 U/L — AB (ref 0–37)
Alkaline Phosphatase: 63 U/L (ref 39–117)
Anion gap: 12 (ref 5–15)
BUN: 15 mg/dL (ref 6–23)
CALCIUM: 7.4 mg/dL — AB (ref 8.4–10.5)
CO2: 22 mEq/L (ref 19–32)
CREATININE: 0.65 mg/dL (ref 0.50–1.10)
Chloride: 105 mEq/L (ref 96–112)
GFR calc Af Amer: >90 mL/min (ref >90)
GFR calc non Af Amer: >90 mL/min (ref >90)
Glucose, Bld: 76 mg/dL (ref 70–99)
Potassium: 4.1 mEq/L (ref 3.7–5.3)
SODIUM: 139 meq/L (ref 137–147)
Total Bilirubin: <0.2 mg/dL — ABNORMAL LOW (ref 0.3–1.2)
Total Protein: 5 g/dL — ABNORMAL LOW (ref 6.0–8.3)

## 2014-04-22 NOTE — Lactation Note (Signed)
This note was copied from the chart of Kim Sharp. Lactation Consultation Note  Follow up visit made.  Mom is not feeling well this AM.  She states she was unable to breastfeed or pump during the night due to feeling poorly.  Encouraged her to attempt and nuzzle at breast and pump every 3 hours.  Parents still working with baby on bottle feeding and getting baby to take enough volume.  Parents state baby has spit after every feeding.  I asked what formula they were giving and they showed me Alimentum 20 calorie.  I spoke with Dr. Kathlene NovemberMcCormick about increasing calories and she agreed to changing to Pregestimil 24 calorie.  Parents given Pregestimil 24 cal and encouraged them to increase volume to 10-20 mls.  Patient Name: Kim Jerelyn CharlesKimilee Hommel BJYNW'GToday's Date: 04/22/2014     Maternal Data    Feeding Feeding Type: Bottle Fed - Formula  LATCH Score/Interventions                      Lactation Tools Discussed/Used     Consult Status      Huston FoleyMOULDEN, Natahsa Marian S 04/22/2014, 9:54 AM

## 2014-04-22 NOTE — Progress Notes (Signed)
Subjective: Postpartum Day two: Cesarean Delivery Patient reports tolerating PO.    Objective: Vital signs in last 24 hours: Temp:  [97.6 F (36.4 C)-98.8 F (37.1 C)] 98.2 F (36.8 C) (12/17 0345) Pulse Rate:  [87-116] 110 (12/17 0345) Resp:  [16-20] 20 (12/17 0345) BP: (80-113)/(42-92) 102/58 mmHg (12/17 0427) SpO2:  [97 %-100 %] 99 % (12/17 0345)  Physical Exam:  General: alert Lochia: appropriate Uterine Fundus: firm Incision: healing well DVT Evaluation: No evidence of DVT seen on physical exam.   Recent Labs  04/21/14 1041 04/21/14 1550  HGB 10.2* 8.7*  HCT 31.2* 25.9*    Assessment/Plan: Status post Cesarean section. Postoperative course complicated by poor urine output  improving  Recheck labs. It ok will d/c foley and iv and send to floor.Marland Kitchen.  Shaindel Sweeten S 04/22/2014, 8:12 AM

## 2014-04-22 NOTE — Progress Notes (Signed)
Patient ID: Kim DownerKimilee P Sharp, female   DOB: October 21, 1985, 28 y.o.   MRN: [id number] Blood pressures normal Labs hgb 70. Serum creatinine now normal Diuresing  Will d/c iv and foley and send to postpartum

## 2014-04-22 NOTE — Progress Notes (Addendum)
Late entry 1300 - After patient showered, RN noted that dressing was approximated 75% saturated.  Dressing removed.  Steri-strips loose and removed.  Small open area to left incision noted with dark red bleeding.  Pressure applied.  Dr. Arelia SneddonMcComb called and notified.  Dr. Arelia SneddonMcComb stated to apply pressure dressing and leave off the steri-strips.  Pressure dressing applied.  Will continue to monitor.

## 2014-04-22 NOTE — Progress Notes (Signed)
Patient's dressing clean, dry, and intact.  Patient to transfer to Mother-Baby Unit this afternoon.

## 2014-04-23 MED ORDER — IBUPROFEN 600 MG PO TABS
600.0000 mg | ORAL_TABLET | Freq: Four times a day (QID) | ORAL | Status: DC | PRN
  Administered 2014-04-23 – 2014-04-24 (×5): 600 mg via ORAL
  Filled 2014-04-23 (×5): qty 1

## 2014-04-23 NOTE — Progress Notes (Signed)
Subjective: Postpartum Day 3: Cesarean Delivery Patient reports tolerating PO, + flatus and no problems voiding.  Baby under phototherapy  Objective: Vital signs in last 24 hours: Temp:  [97.9 F (36.6 C)-98.8 F (37.1 C)] 97.9 F (36.6 C) (12/18 0420) Pulse Rate:  [77-104] 88 (12/18 0420) Resp:  [16-20] 16 (12/18 0420) BP: (110-136)/(54-80) 135/60 mmHg (12/18 0420) SpO2:  [99 %-100 %] 99 % (12/18 0420)  Physical Exam:  General: alert and cooperative Lochia: appropriate Uterine Fundus: firm Incision: pressure dressing and honeycomb dressing removed, no active bleeding noted. steristrips reapplied and honeycomb dressing DVT Evaluation: No evidence of DVT seen on physical exam. Negative Homan's sign. No cords or calf tenderness. Calf/Ankle edema is present. DTR's 2+, no clonus   Recent Labs  04/21/14 1550 04/22/14 0850  HGB 8.7* 7.9*  HCT 25.9* 23.3*    Assessment/Plan: Status post Cesarean section. Postoperative course complicated by anemia and preeclampisa Plan discharge in am..  Isys Tietje G 04/23/2014, 8:58 AM

## 2014-04-23 NOTE — Lactation Note (Signed)
This note was copied from the chart of Kim Jerelyn CharlesKimilee Pettengill. Lactation Consultation Note     Brief follow up consult with this mom and baby, now 6160 hours old. The baby is now 7136 1/7 weeks CGA, weighs 4 lbs 1.8 oz, and is under double phototherapy. Mom says she ip pumping and feeding EBM, but no EBM is charted as being fed to baby. The baby has been taking only 10 mls every 3 hours, but last feeding took 30 mls of Pregestimil 24 cal. Mom has a DEP at home, and denies any questions at this time. Baby is being bottle fed.   Patient Name: Kim Sharp WJXBJ'YToday's Date: 04/23/2014 Reason for consult: Follow-up assessment   Maternal Data    Feeding Feeding Type: Formula Nipple Type: Slow - flow  LATCH Score/Interventions                      Lactation Tools Discussed/Used     Consult Status Consult Status: Follow-up Date: 04/24/14 Follow-up type: In-patient    Alfred LevinsLee, Doreena Maulden Anne 04/23/2014, 8:20 AM

## 2014-04-24 MED ORDER — FERROUS SULFATE 325 (65 FE) MG PO TABS: 325.0000 mg | ORAL_TABLET | Freq: Two times a day (BID) | ORAL | Status: DC

## 2014-04-24 MED ORDER — IBUPROFEN 600 MG PO TABS: 600.0000 mg | ORAL_TABLET | Freq: Four times a day (QID) | ORAL | Status: DC | PRN

## 2014-04-24 MED ORDER — HYDROMORPHONE HCL 2 MG PO TABS: 1.0000 mg | ORAL_TABLET | ORAL | Status: DC | PRN

## 2014-04-24 NOTE — Lactation Note (Signed)
This note was copied from the chart of Kim Jerelyn CharlesKimilee Cirigliano. Lactation Consultation Note  Mom's milk is increasing in volume.  She plans to pump and bottle feed.  Kim Sharp is not eating a full 30 ml at 3 hours intervals but he has had about 120 ml in the past 12 hours.  Encouraged to give him at least 10 oz in the next 24 hours.  She reports having a breast pump at home.  Encouraged to call with any questions or concerns. Patient Name: Kim Sharp ZOXWR'UToday's Date: 04/24/2014     Maternal Data Does the patient have breastfeeding experience prior to this delivery?: Yes  Feeding Feeding Type: Breast Fed Nipple Type: Slow - flow  LATCH Score/Interventions                      Lactation Tools Discussed/Used     Consult Status      Kim Sharp, Kim Sharp 04/24/2014, 9:19 AM

## 2014-04-24 NOTE — Discharge Summary (Signed)
Obstetric Discharge Summary Reason for Admission: cesarean section Prenatal Procedures: NST, Preeclampsia and ultrasound Intrapartum Procedures: cesarean: low cervical, transverse Postpartum Procedures: none Complications-Operative and Postpartum: none HEMOGLOBIN  Date Value Ref Range Status  04/22/2014 7.9* 12.0 - 15.0 g/dL Final   HCT  Date Value Ref Range Status  04/22/2014 23.3* 36.0 - 46.0 % Final    Physical Exam:  General: alert Lochia: appropriate Uterine Fundus: firm Incision: healing well DVT Evaluation: No evidence of DVT seen on physical exam.  Discharge Diagnoses: Term Pregnancy-delivered, chronic HTN, Pre-E  Discharge Information: Date: 04/24/2014 Activity: pelvic rest Diet: routine Medications: PNV, Ibuprofen, Iron and Percocet Condition: stable Instructions: refer to practice specific booklet Discharge to: home Follow-up Information    Follow up with Meriel PicaHOLLAND,Vicke Plotner M, MD In 1 week.   Specialty:  Obstetrics and Gynecology   Contact information:   [address] [place] [address] [phone]       Newborn Data: Live born female  Birth Weight: 4 lb 6.5 oz (1999 g) APGAR: 8, 9  Home with mother.  Anjel Perfetti M 04/24/2014, 9:29 AM

## 2014-04-29 ENCOUNTER — Observation Stay (HOSPITAL_COMMUNITY)
Admission: AD | Admit: 2014-04-29 | Discharge: 2014-04-29 | Disposition: A | Discharge status: Home / Self Care | Payer: 59 | Source: Ambulatory Visit | Attending: Obstetrics & Gynecology | Admitting: Obstetrics & Gynecology

## 2014-04-29 ENCOUNTER — Observation Stay (HOSPITAL_COMMUNITY): Payer: 59

## 2014-04-29 ENCOUNTER — Encounter (HOSPITAL_COMMUNITY): Payer: Self-pay | Admitting: *Deleted

## 2014-04-29 DIAGNOSIS — N99820 Postprocedural hemorrhage and hematoma of a genitourinary system organ or structure following a genitourinary system procedure: Secondary | ICD-10-CM | POA: Insufficient documentation

## 2014-04-29 DIAGNOSIS — R1011 Right upper quadrant pain: Secondary | ICD-10-CM | POA: Insufficient documentation

## 2014-04-29 DIAGNOSIS — O9089 Other complications of the puerperium, not elsewhere classified: Secondary | ICD-10-CM | POA: Diagnosis not present

## 2014-04-29 DIAGNOSIS — O864 Pyrexia of unknown origin following delivery: Secondary | ICD-10-CM | POA: Diagnosis present

## 2014-04-29 LAB — COMPREHENSIVE METABOLIC PANEL
ALT: 75 U/L — ABNORMAL HIGH (ref 0–35)
AST: 71 U/L — AB (ref 0–37)
Albumin: 2.8 g/dL — ABNORMAL LOW (ref 3.5–5.2)
Alkaline Phosphatase: 82 U/L (ref 39–117)
Anion gap: 9 (ref 5–15)
BUN: 13 mg/dL (ref 6–23)
CALCIUM: 8.3 mg/dL — AB (ref 8.4–10.5)
CO2: 23 mmol/L (ref 19–32)
CREATININE: 0.66 mg/dL (ref 0.50–1.10)
Chloride: 103 mEq/L (ref 96–112)
GFR calc Af Amer: >90 mL/min (ref >90)
GFR calc non Af Amer: >90 mL/min (ref >90)
Glucose, Bld: 107 mg/dL — ABNORMAL HIGH (ref 70–99)
Potassium: 4.6 mmol/L (ref 3.5–5.1)
Sodium: 135 mmol/L (ref 135–145)
TOTAL PROTEIN: 6.4 g/dL (ref 6.0–8.3)
Total Bilirubin: 1.2 mg/dL (ref 0.3–1.2)

## 2014-04-29 LAB — URINE MICROSCOPIC-ADD ON

## 2014-04-29 LAB — CBC
HCT: 27.8 % — ABNORMAL LOW (ref 36.0–46.0)
Hemoglobin: 8.8 g/dL — ABNORMAL LOW (ref 12.0–15.0)
MCH: 30.3 pg (ref 26.0–34.0)
MCHC: 31.7 g/dL (ref 30.0–36.0)
MCV: 95.9 fL (ref 78.0–100.0)
PLATELETS: 429 10*3/uL — AB (ref 150–400)
RBC: 2.9 MIL/uL — ABNORMAL LOW (ref 3.87–5.11)
RDW: 15.5 % (ref 11.5–15.5)
WBC: 13.5 10*3/uL — ABNORMAL HIGH (ref 4.0–10.5)

## 2014-04-29 LAB — URINALYSIS, ROUTINE W REFLEX MICROSCOPIC
BILIRUBIN URINE: NEGATIVE
Glucose, UA: NEGATIVE mg/dL
KETONES UR: NEGATIVE mg/dL
NITRITE: NEGATIVE
PROTEIN: 30 mg/dL — AB
SPECIFIC GRAVITY, URINE: 1.02 (ref 1.005–1.030)
Urobilinogen, UA: 2 mg/dL — ABNORMAL HIGH (ref 0.0–1.0)
pH: 7 (ref 5.0–8.0)

## 2014-04-29 MED ORDER — PRENATAL MULTIVITAMIN CH: 1.0000 | ORAL_TABLET | Freq: Every day | ORAL | Status: DC

## 2014-04-29 MED ORDER — ALUM & MAG HYDROXIDE-SIMETH 200-200-20 MG/5ML PO SUSP: 30.0000 mL | ORAL | Status: DC | PRN

## 2014-04-29 MED ORDER — HYDROMORPHONE HCL 1 MG/ML IJ SOLN
1.0000 mg | Freq: Once | INTRAMUSCULAR | Status: AC
  Administered 2014-04-29: 1 mg via INTRAMUSCULAR
  Filled 2014-04-29: qty 1

## 2014-04-29 MED ORDER — SENNA 8.6 MG PO TABS: 1.0000 | ORAL_TABLET | Freq: Two times a day (BID) | ORAL | Status: DC

## 2014-04-29 MED ORDER — LACTATED RINGERS IV SOLN: INTRAVENOUS | Status: DC

## 2014-04-29 MED ORDER — IBUPROFEN 800 MG PO TABS: 800.0000 mg | ORAL_TABLET | Freq: Three times a day (TID) | ORAL | Status: DC | PRN

## 2014-04-29 MED ORDER — SIMETHICONE 80 MG PO CHEW: 80.0000 mg | CHEWABLE_TABLET | Freq: Four times a day (QID) | ORAL | Status: DC | PRN

## 2014-04-29 MED ORDER — CLINDAMYCIN HCL 300 MG PO CAPS: 300.0000 mg | ORAL_CAPSULE | Freq: Four times a day (QID) | ORAL | Status: DC

## 2014-04-29 MED ORDER — KETOROLAC TROMETHAMINE 60 MG/2ML IM SOLN
60.0000 mg | Freq: Once | INTRAMUSCULAR | Status: AC
  Administered 2014-04-29: 60 mg via INTRAMUSCULAR
  Filled 2014-04-29: qty 2

## 2014-04-29 MED ORDER — ONDANSETRON HCL 4 MG/2ML IJ SOLN
4.0000 mg | Freq: Four times a day (QID) | INTRAMUSCULAR | Status: DC | PRN
Start: 2014-04-29 — End: 2014-04-29

## 2014-04-29 MED ORDER — DOCUSATE SODIUM 100 MG PO CAPS: 100.0000 mg | ORAL_CAPSULE | Freq: Two times a day (BID) | ORAL | Status: DC

## 2014-04-29 MED ORDER — ONDANSETRON HCL 4 MG PO TABS: 4.0000 mg | ORAL_TABLET | Freq: Four times a day (QID) | ORAL | Status: DC | PRN

## 2014-04-29 NOTE — MAU Provider Note (Signed)
History     CSN: [id number]  Arrival date and time: 04/29/14 1810   First Provider Initiated Contact with Patient 04/29/14 1838      Chief Complaint  Patient presents with   Post-op Problem   HPI Comments: Kim Sharp 28 y.o. [id number] presents to MAU with abdominal pains that started at 4 am while she was sleeping. She tried heat, Motrin and comfort measure that did not help. She rates pain as 8/10 and its focus is more right sided and shoots down into leg. She has been having bowel movement and passing gas without difficulty. She denies any nausea and vomiting. She felt chills and feverish at home.      Past Medical History  Diagnosis Date   Atrial septal defect and mitral stenosis    VSD (ventricular septal defect)    Cystic fibrosis carrier    H/O varicella    Headache(784.0)    Obesity    Pregnancy induced hypertension     Past Surgical History  Procedure Laterality Date   Cesarean section  01/26/2012    Procedure: CESAREAN SECTION;  Surgeon: Meriel Picaichard M Holland, MD;  Location: [facility];  Service: Obstetrics;  Laterality: N/A;  Primary Cesarean Section Delivery Baby Boy @ [phone], Apgar 9/9    Cardiac surgery      asd and vsd repair   Cesarean section N/A 04/20/2014    Procedure: CESAREAN SECTION;  Surgeon: Leslie AndreaJames E Tomblin II, MD;  Location: [facility];  Service: Obstetrics;  Laterality: N/A;    Family History  Problem Relation Age of Onset   Hypertension Father    Stroke Father    Heart disease Paternal Grandmother     History  Substance Use Topics   Smoking status: Never Smoker    Smokeless tobacco: Never Used   Alcohol Use: No    Allergies:  Allergies  Allergen Reactions   Codeine Nausea Only and Rash    Causes nausea.   Penicillins Nausea Only    Causes nausea. Causes nausea.   Butorphanol Tartrate Other (See Comments)   Stadol [Butorphanol] Other (See Comments)    Caused hallucinations.    Prescriptions prior to admission   Medication Sig Dispense Refill Last Dose   famotidine (PEPCID) 20 MG tablet Take 20 mg by mouth daily.   04/29/2014 at Unknown time   ferrous sulfate (FERROUSUL) 325 (65 FE) MG tablet Take 1 tablet (325 mg total) by mouth 2 (two) times daily with a meal. 60 tablet 3 04/28/2014 at Unknown time   HYDROmorphone (DILAUDID) 2 MG tablet Take 0.5 tablets (1 mg total) by mouth every 3 (three) hours as needed for severe pain. 30 tablet 0 Past Week at Unknown time   ibuprofen (ADVIL,MOTRIN) 600 MG tablet Take 1 tablet (600 mg total) by mouth every 6 (six) hours as needed for moderate pain. 30 tablet 1 04/29/2014 at Unknown time   Prenatal Vit-Fe Fumarate-FA (PRENATAL MULTIVITAMIN) TABS tablet Take 1 tablet by mouth at bedtime.   04/28/2014 at Unknown time    Review of Systems  Constitutional: Positive for fever and chills.  Gastrointestinal: Positive for abdominal pain. Negative for nausea and vomiting.  Skin: Negative.   Neurological: Positive for headaches.  Psychiatric/Behavioral: Negative.    Physical Exam   Blood pressure 139/85, pulse 124, temperature 98.5 F (36.9 C), temperature source Oral, resp. rate 22, height 5\' 7"  (1.702 m), weight 127.007 kg (280 lb), last menstrual period 08/13/2013, unknown if currently breastfeeding.  Physical Exam  Constitutional: She  is oriented to person, place, and time. She appears well-developed and well-nourished. She appears distressed.  Appears uncomfortable   HENT:  Head: Normocephalic and atraumatic.  Cardiovascular:  tachycardia  Respiratory: Effort normal and breath sounds normal.  GI: Bowel sounds are normal. She exhibits distension. There is tenderness.  Musculoskeletal: Normal range of motion.  Neurological: She is alert and oriented to person, place, and time.  Skin: Skin is warm and dry.  Psychiatric: She has a normal mood and affect. Her behavior is normal. Judgment and thought content normal.   Results for orders placed or performed  during the hospital encounter of 04/29/14 (from the past 24 hour(s))  Urinalysis, Routine w reflex microscopic     Status: Abnormal   Collection Time: 04/29/14  6:21 PM  Result Value Ref Range   Color, Urine YELLOW YELLOW   APPearance HAZY (A) CLEAR   Specific Gravity, Urine 1.020 1.005 - 1.030   pH 7.0 5.0 - 8.0   Glucose, UA NEGATIVE NEGATIVE mg/dL   Hgb urine dipstick LARGE (A) NEGATIVE   Bilirubin Urine NEGATIVE NEGATIVE   Ketones, ur NEGATIVE NEGATIVE mg/dL   Protein, ur 30 (A) NEGATIVE mg/dL   Urobilinogen, UA 2.0 (H) 0.0 - 1.0 mg/dL   Nitrite NEGATIVE NEGATIVE   Leukocytes, UA SMALL (A) NEGATIVE  Urine microscopic-add on     Status: Abnormal   Collection Time: 04/29/14  6:21 PM  Result Value Ref Range   Squamous Epithelial / LPF RARE RARE   WBC, UA 7-10 <3 WBC/hpf   RBC / HPF 21-50 <3 RBC/hpf   Bacteria, UA FEW (A) RARE  CBC     Status: Abnormal   Collection Time: 04/29/14  7:08 PM  Result Value Ref Range   WBC 13.5 (H) 4.0 - 10.5 K/uL   RBC 2.90 (L) 3.87 - 5.11 MIL/uL   Hemoglobin 8.8 (L) 12.0 - 15.0 g/dL   HCT 09.827.8 (L) 11.936.0 - 14.746.0 %   MCV 95.9 78.0 - 100.0 fL   MCH 30.3 26.0 - 34.0 pg   MCHC 31.7 30.0 - 36.0 g/dL   RDW 82.915.5 56.211.5 - 13.015.5 %   Platelets 429 (H) 150 - 400 K/uL    MAU Course  Procedures  MDM Spoke with Dr Langston MaskerMorris who advised CBC, CMET and pain management Retook temp and now 99.9  Retook 15 min later 100.6 and now 102.5 Dilaudid 1 mg IM Toradol 60 mg IM Spoke with Dr Langston MaskerMorris who will admit patient Assessment and Plan   Admit  Carolynn ServeBarefoot, Linda Miller 04/29/2014, 7:14 PM

## 2014-04-29 NOTE — Discharge Instructions (Signed)
Call MD for T>100.4 after 24 hours of antibiotics.  Call for worsening of pain or symptoms of infection, nausea and/or  vomiting, or respiratory distress.  Call office to schedule appointment on Monday.

## 2014-04-29 NOTE — MAU Note (Addendum)
States she had a C/S on 12/15. States she was doing well, went for incision check yesterday and everything was OK. States she woke up this morning with severe abdominal pain. Points to RUQ and around to lower part. Not relieved by ibuprofen. States has rx for dilaudid, but cannot take it, gives her a HA. No N/V/D. States having normal bowel movements. States warm shower and heating pad helped, but is just as severe when heat removed.

## 2014-04-29 NOTE — Progress Notes (Signed)
Patient presents to MAU POD#9 s/p repeat C/S secondary to pre-eclampsia.  She was doing well postop until 4am today when she had severe abdominal pain and fever and chills.  She denies nausea and/or vomiting.  She reports no current abdominal pain after receiving Toradol and Dilaudid.  She denies GI or GU complaint.  She is breastfeeding and reports no breast problems/pain.  She denies chest pain or cough as well as lower extremity pain.  VS-BPs nl-mild range.   Tmax 102.2 CBC-wbc 13.5, hgb 8.8 Urine with blood and leuks; will send for C&S  CT A/P-rectus hematoma, measuring 10.5 x 10.4 x 3.0 cm; per conversation with radiology, not abscess.  Also, anterior abdominal wall fluid collection measuring 11.9 x 1.4 x 3.6 cm; likely seroma but cannot r/o abscess.  Gen: A&O x 3 HEENT: normocephalic, atraumatic CV: RRR Pulm: CTAB Abd: obese, ecchymosis  4 cm below umbilicus; midline.  ~ 12 cm indurated area underlying ecchymosis.  Well-healing Pfannenstiel.  No rebound or guarding.  Per pt, previous severe pain was at the site of induration. Ext: no c/c/e Pelvic: SSE-normal lochia, no abnormality with vagina or cervix.  Bimanual-no cervical motion tenderness or uterine tenderness.  Assessment-postop hematoma, fever -Patient is encouraged to be admitted for inpatient IV abx.  She strongly desires to go home to be with her 29 year old for Christmas morning.  Since she is tolerating po and feeling well, I agree to discharge home with po antibiotics (Clinda) as long as she returns with worsening in symptoms or no improvement in 24 hours. -F/U in office Monday  Mitchel HonourMegan Evan Osburn, DO

## 2014-05-04 ENCOUNTER — Inpatient Hospital Stay (HOSPITAL_COMMUNITY): Admission: AD | Admit: 2014-05-04 | Payer: 59 | Source: Ambulatory Visit | Admitting: Obstetrics and Gynecology

## 2014-05-04 SURGERY — Surgical Case
Anesthesia: Regional

## 2014-05-09 ENCOUNTER — Inpatient Hospital Stay (HOSPITAL_COMMUNITY)
Admission: AD | Admit: 2014-05-09 | Discharge: 2014-05-09 | Disposition: A | Discharge status: Home / Self Care | Payer: 59 | Source: Ambulatory Visit | Attending: Obstetrics and Gynecology | Admitting: Obstetrics and Gynecology

## 2014-05-09 ENCOUNTER — Encounter (HOSPITAL_COMMUNITY): Payer: Self-pay

## 2014-05-09 DIAGNOSIS — O9989 Other specified diseases and conditions complicating pregnancy, childbirth and the puerperium: Secondary | ICD-10-CM | POA: Diagnosis present

## 2014-05-09 DIAGNOSIS — O902 Hematoma of obstetric wound: Secondary | ICD-10-CM | POA: Diagnosis not present

## 2014-05-09 MED ORDER — CEPHALEXIN 250 MG PO CAPS: 250.0000 mg | ORAL_CAPSULE | Freq: Four times a day (QID) | ORAL | Status: DC

## 2014-05-09 NOTE — Discharge Instructions (Signed)
Apply dressings as needed Call if temperature is > 100.4  Hematoma   A hematoma is a collection of blood under the skin, in an organ, in a body space, in a joint space, or in other tissue. The blood can clot to form a lump that you can see and feel. The lump is often firm and may sometimes become sore and tender. Most hematomas get better in a few days to weeks. However, some hematomas may be serious and require medical care. Hematomas can range in size from very small to very large.  CAUSES  A hematoma can be caused by a blunt or penetrating injury. It can also be caused by spontaneous leakage from a blood vessel under the skin. Spontaneous leakage from a blood vessel is more likely to occur in older people, especially those taking blood thinners. Sometimes, a hematoma can develop after certain medical procedures.  SIGNS AND SYMPTOMS  A firm lump on the body.  Possible pain and tenderness in the area.  Bruising. Blue, dark blue, purple-red, or yellowish skin may appear at the site of the hematoma if the hematoma is close to the surface of the skin. For hematomas in deeper tissues or body spaces, the signs and symptoms may be subtle. For example, an intra-abdominal hematoma may cause abdominal pain, weakness, fainting, and shortness of breath. An intracranial hematoma may cause a headache or symptoms such as weakness, trouble speaking, or a change in consciousness.  DIAGNOSIS  A hematoma can usually be diagnosed based on your medical history and a physical exam. Imaging tests may be needed if your health care provider suspects a hematoma in deeper tissues or body spaces, such as the abdomen, head, or chest. These tests may include ultrasonography or a CT scan.  TREATMENT  Hematomas usually go away on their own over time. Rarely does the blood need to be drained out of the body. Large hematomas or those that may affect vital organs will sometimes need surgical drainage or monitoring.

## 2014-05-09 NOTE — Progress Notes (Signed)
S/P repeat C/S on 04/20/14. Has had burning pain in incision, difficult to stand. Wound hematoma noted in office and she was placed on clindamycin. Tonight incision opened and she had a gush of brown fluid. No fever, no chills. Has finished clindamycin.  VSS Afeb Patient in no acute distress. Feels better since hemotoma drained. Abdomen soft Incision - edges healing well, no erythema      In center of incision is a 8 mm opening draining dark red fluid      incision probed with sterile cotton swab-fascia intact      No purulence noted      Dressing applied  A/P: wound hematoma spontaneously drained         Dressings as needed         Keflex (has had PCN multiple times without trouble, was nauseous one time with po PCN. No rash, no SOB)         FU office 3 days as scheduled

## 2014-05-09 NOTE — MAU Note (Addendum)
C/section 04/20/14. Diagnosed with hematoma 12/24. Increased pain tonight and leaking. Small area where incision has opened and "erupting" brown discharge with foul odor.

## 2015-02-11 ENCOUNTER — Telehealth: Payer: 59 | Admitting: Family

## 2015-02-11 DIAGNOSIS — B9789 Other viral agents as the cause of diseases classified elsewhere: Secondary | ICD-10-CM

## 2015-02-11 DIAGNOSIS — J329 Chronic sinusitis, unspecified: Secondary | ICD-10-CM

## 2015-02-11 DIAGNOSIS — B349 Viral infection, unspecified: Secondary | ICD-10-CM

## 2015-02-11 MED ORDER — FLUTICASONE PROPIONATE 50 MCG/ACT NA SUSP: 2.0000 | Freq: Every day | NASAL | Status: DC

## 2015-02-11 NOTE — Progress Notes (Signed)
° °  We are sorry that you are not feeling well.  Here is how we plan to help!  Based on what you have shared with me it looks like you have sinusitis.  Sinusitis is inflammation and infection in the sinus cavities of the head.  Based on your presentation I believe you most likely have Acute Viral Sinusitis. This is an infection most likely caused by a virus.  There is not specific treatment for viral sinusitis other than to help you with the symptoms until the infection runs it's course.  You may use an oral decongestant such as Mucinex D or if you have glaucoma or high blood pressure use plain Mucinex.  Saline nasal spray help and can safely be used as often as needed for congestion, I have prescribed fluticason nasal spray. Spray two sprays in each nostril twice a day to help reduce your symptoms.  Some authorities believe that zinc sprays or the use of Echinacea may shorten the course of your symptoms.  Sinus infections are not as easily transmitted as other respiratory infection, however we still recommend that you avoid close contact with loved ones, especially the very young and elderly.  Remember to wash your hands thoroughly throughout the day as this is the number one way to prevent the spread of infection!  Home Care: Only take medications as instructed by your medical team. Complete the entire course of an antibiotic. Do not take these medications with alcohol. A steam or ultrasonic humidifier can help congestion.  You can place a towel over your head and breathe in the steam from hot water coming from a faucet. Avoid close contacts especially the very young and the elderly. Cover your mouth when you cough or sneeze. Always remember to wash your hands.  Get Help Right Away If: You develop worsening fever or sinus pain. You develop a severe head ache or visual changes. Your symptoms persist after you have completed your treatment plan.  Make sure you Understand these  instructions. Will watch your condition. Will get help right away if you are not doing well or get worse.  Your e-visit answers were reviewed by a board certified advanced clinical practitioner to complete your personal care plan.  Depending on the condition, your plan could have included both over the counter or prescription medications.  If there is a problem please reply  once you have received a response from your provider.  Your safety is important to [place].  If you have drug allergies check your prescription carefully.    You can use MyChart to ask questions about todays visit, request a non-urgent call back, or ask for a work or school excuse for 24 hours related to this e-Visit. If it has been greater than 24 hours you will need to follow up with your provider, or enter a new e-Visit to address those concerns.  You will get an e-mail in the next two days asking about your experience.  I hope that your e-visit has been valuable and will speed your recovery. Thank you for using e-visits.

## 2015-02-16 IMAGING — US US OB FOLLOW-UP
1 series · 12 of 28 positions shown · non-contrast
Comparison: none

[Series 1: us ob follow-up · 41 acquisitions, 12 frames shown]
[im 2/41]
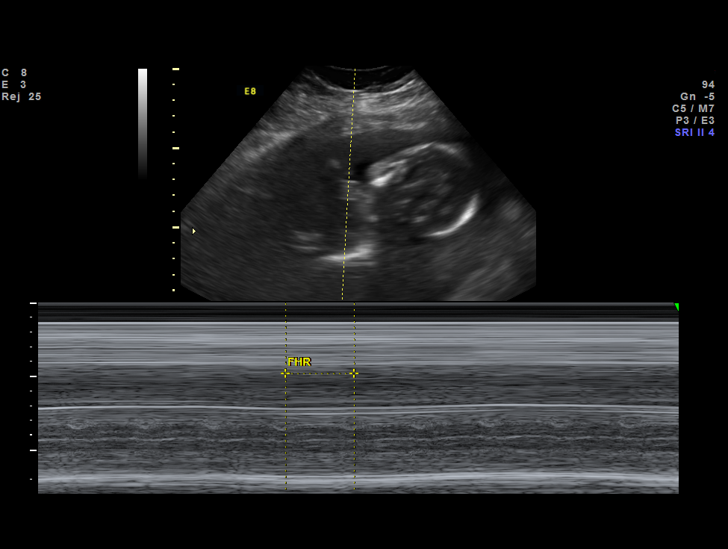
[im 5/41]
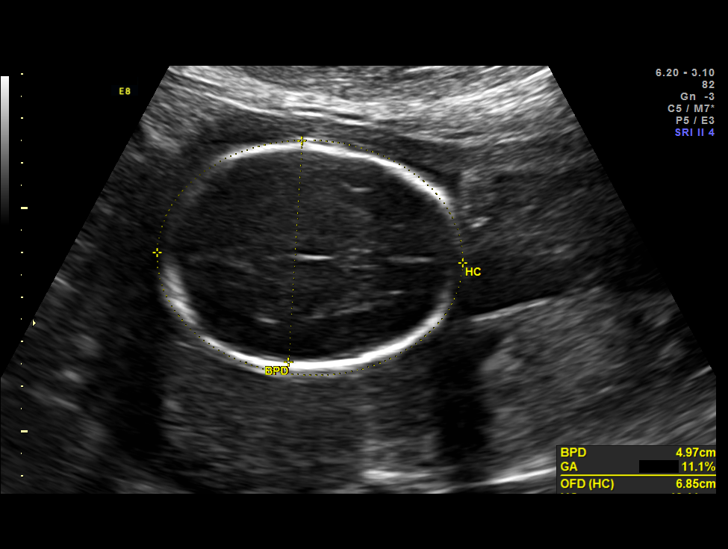
[im 8/41]
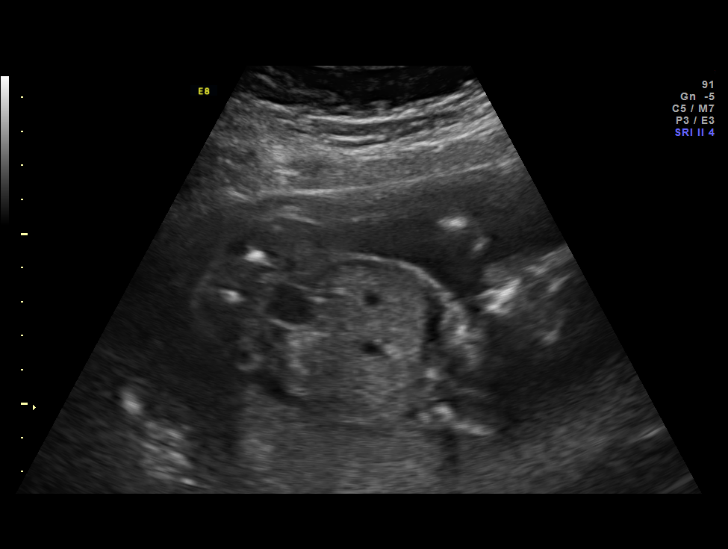
[im 12/41]
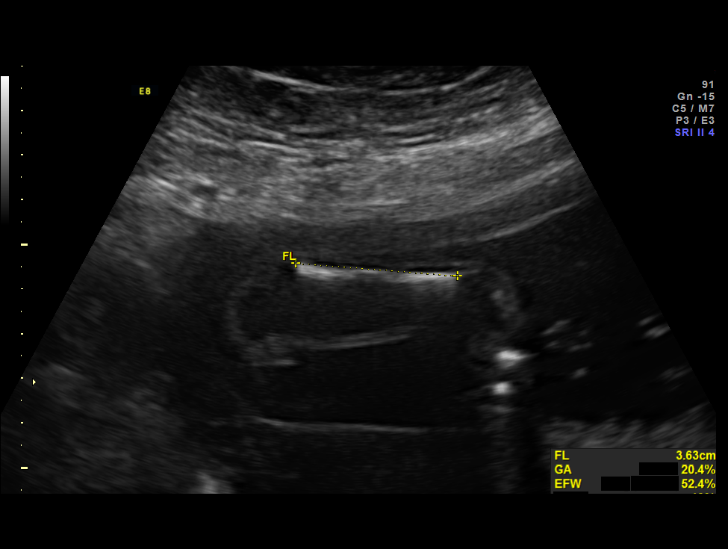
[im 15/41]
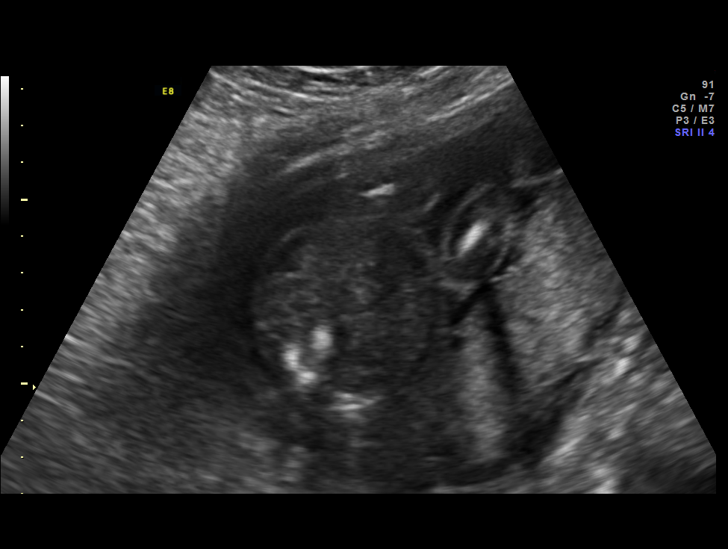
[im 18/41]
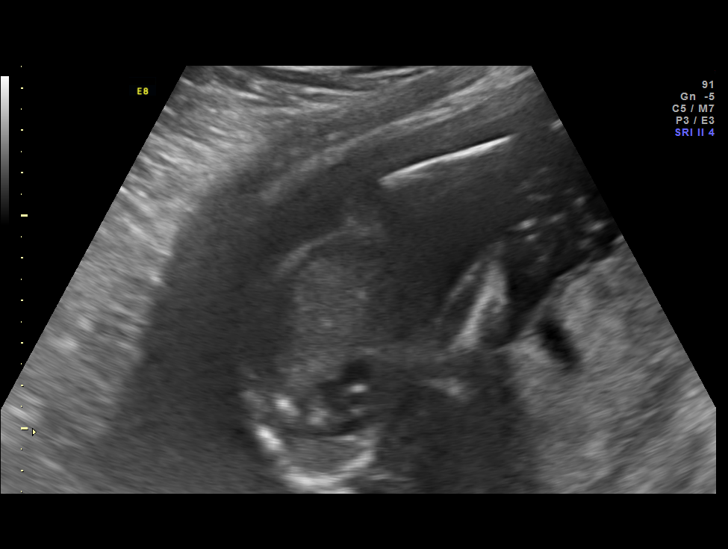
[im 23/41]
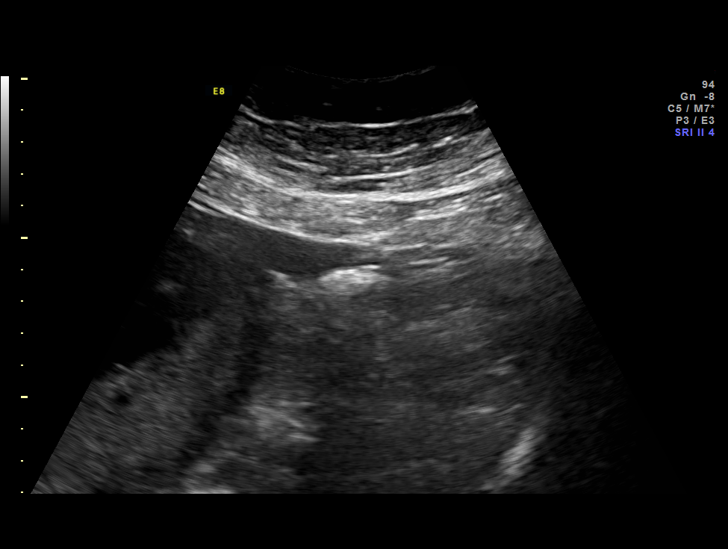
[im 26/41]
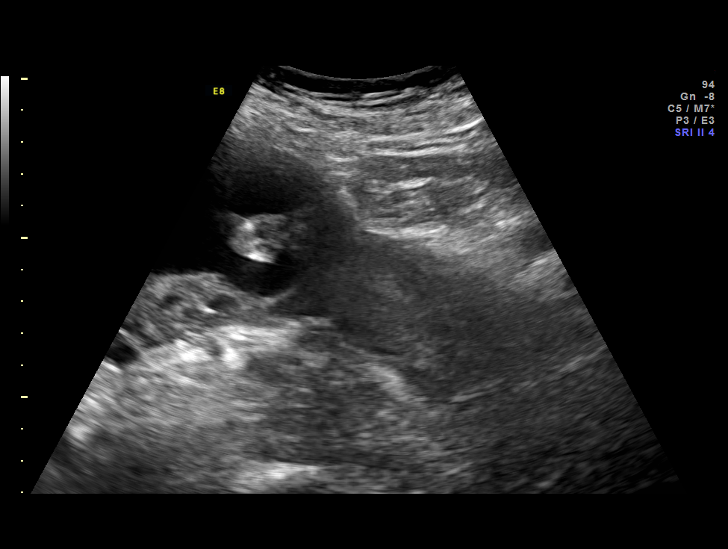
[im 29/41]
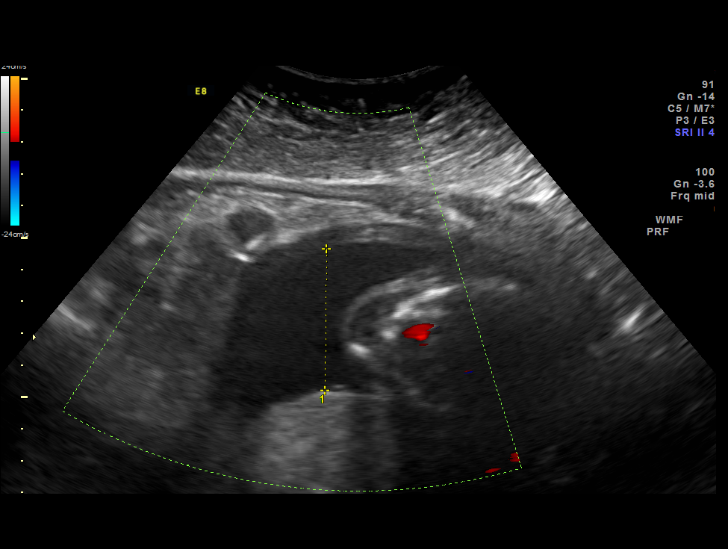
[im 33/41]
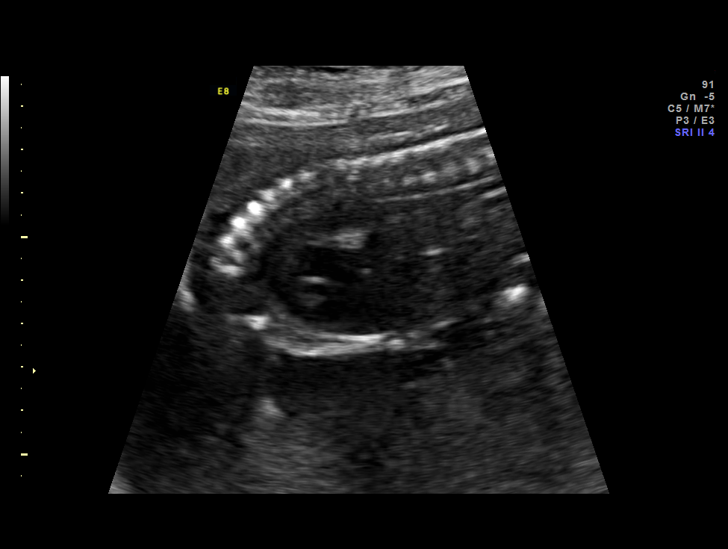
[im 36/41]
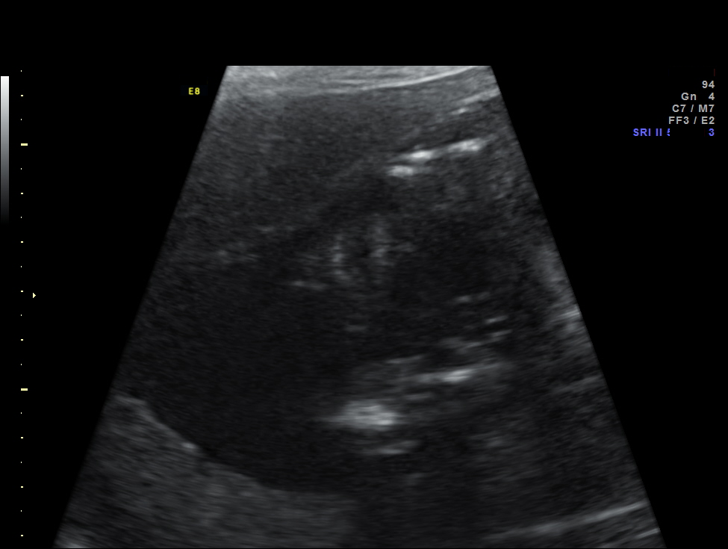
[im 39/41]
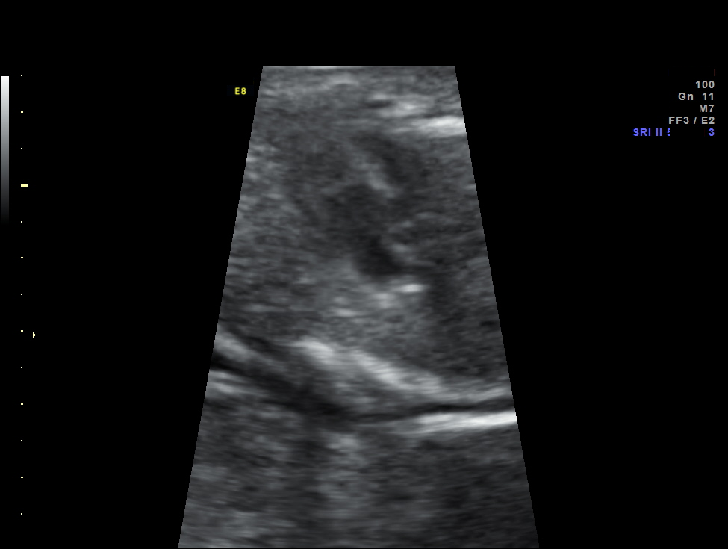

[12 of 28 positions shown; findings below may reference images not displayed]

OBSTETRICS REPORT
                      (Signed Final 01/15/2014 [DATE])

Service(s) Provided

 US OB FOLLOW UP                                       76816.1
Indications

 History of genetic / anatomic abnormality - pt with
 ASD/VSD repair; fetal ECHO normal
 History of cesarean delivery, currently pregnant      654.20,
 Maternal obesity (287 lb)
 Follow-up incomplete fetal anatomic evaluation
 Assess fetal growth
 History of genetic / anatomic abnormality - [REDACTED]5.23
 CF carrier
Fetal Evaluation

 Num Of Fetuses:    1
 Fetal Heart Rate:  147                          bpm
 Cardiac Activity:  Observed
 Presentation:      Breech
 Placenta:          Posterior, low-lying,
                    cm from int os
 P. Cord            Previously seen as normal
 Insertion:

 Amniotic Fluid
 AFI FV:      Subjectively within normal limits
                                             Larg Pckt:     4.4  cm
Biometry

 BPD:     49.2  mm     G. Age:  20w 6d                CI:         71.9   70 - 86
 OFD:     68.4  mm                                    FL/HC:      19.2   18.4 -

 HC:     190.6  mm     G. Age:  21w 3d       11  %    HC/AC:      1.01   1.06 -

 AC:     189.5  mm     G. Age:  23w 5d       86  %    FL/BPD:     74.2   71 - 87
 FL:      36.5  mm     G. Age:  21w 4d       23  %    FL/AC:      19.3   20 - 24

 Est. FW:     513  gm      1 lb 2 oz     54  %
Gestational Age

 LMP:           22w 1d        Date:  08/13/13                 EDD:   05/20/14
 U/S Today:     21w 6d                                        EDD:   05/22/14
 Best:          22w 1d     Det. By:  LMP  (08/13/13)          EDD:   05/20/14
Anatomy

 Cranium:          Appears normal         Aortic Arch:      Appears normal
 Fetal Cavum:      Previously seen        Ductal Arch:      Appears normal
 Ventricles:       Appears normal         Diaphragm:        Appears normal
 Choroid Plexus:   Previously seen        Stomach:          Appears normal, left
                                                            sided
 Cerebellum:       Previously seen        Abdomen:          Appears normal
 Posterior Fossa:  Previously seen        Abdominal Wall:   Previously seen
 Nuchal Fold:      Previously seen        Cord Vessels:     Previously seen
 Face:             Orbits and profile     Kidneys:          Appear normal
                   previously seen
 Lips:             Appears normal         Bladder:          Appears normal
 Heart:            Appears normal         Spine:            Previously seen
                   (4CH, axis, and
                   situs)
 RVOT:             Appears normal         Lower             Previously seen
                                          Extremities:
 LVOT:             Appears normal         Upper             Previously seen
                                          Extremities:

 Other:  Male gender previously seen. Heels and Nasal bone previously
         visualized. Technically difficult due to maternal habitus and fetal
         position.
Targeted Anatomy

 Fetal Central Nervous System
 Lat. Ventricles:
Cervix Uterus Adnexa

 Cervical Length:    4.7      cm

 Cervix:       Normal appearance by transabdominal scan.

 Adnexa:     No abnormality visualized.
Impression

 SIUP at 22+1 weeks
 Normal interval anatomy; anatomic survey complete
 Normal amniotic fluid volume
 Appropriate interval growth with EFW at the 54th %tile
 Inferior posterior placental edge appeared to be borderline
 close to internal os (1.8 cms today; 2.6 cms on last study)

 Normal fetal ECHO
Recommendations

 Serial ultrasounds for growth (increased BMI)
 Reassess placental location in the early third trimester

 We would be happy to perform these exams.  If desired,
 please call to schedule.

 questions or concerns.

## 2015-05-23 IMAGING — US US RENAL
1 series · 14 of 22 positions shown · non-contrast
Comparison: None.

CLINICAL DATA: Decreased urine volume status post cesarean section

EXAM:
RENAL/URINARY TRACT ULTRASOUND COMPLETE

[Series 1: us renal · 14 of 22 slices shown]
[im 1/22]
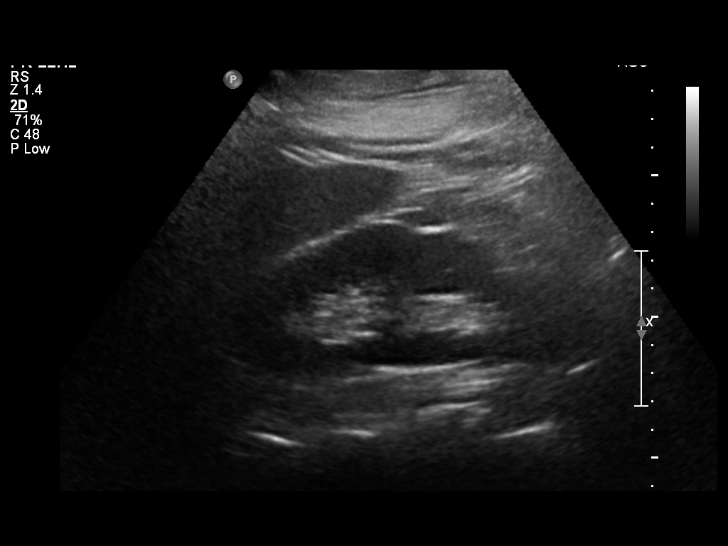
[im 3/22]
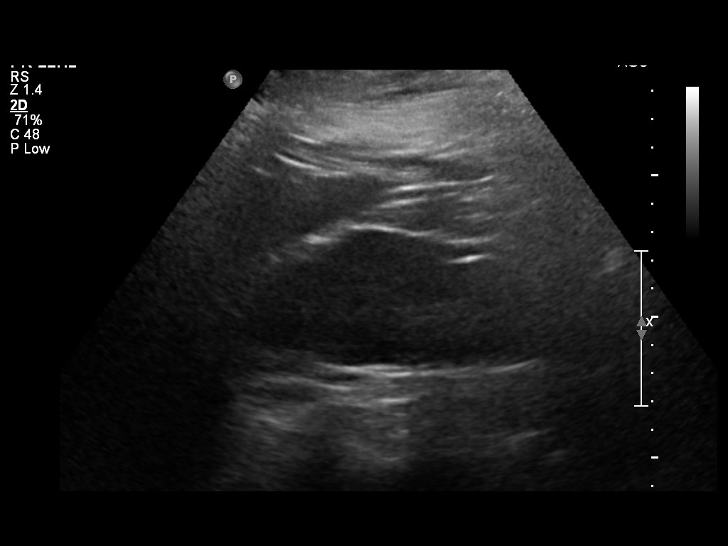
[im 4/22]
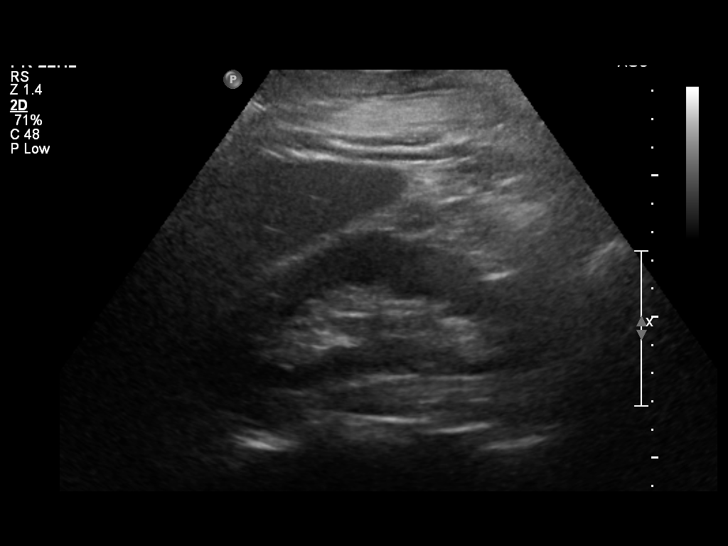
[im 6/22]
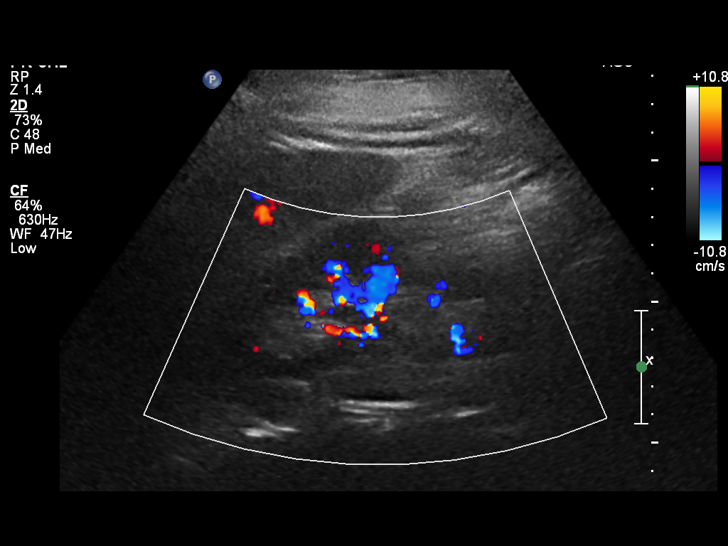
[im 8/22]
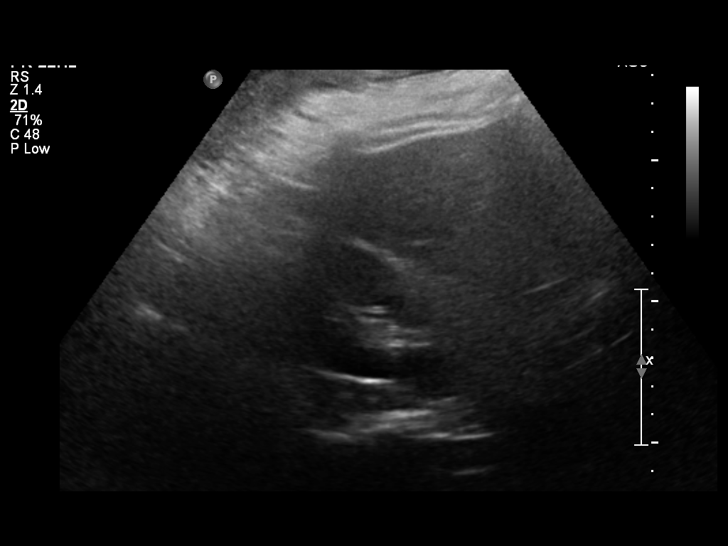
[im 9/22]
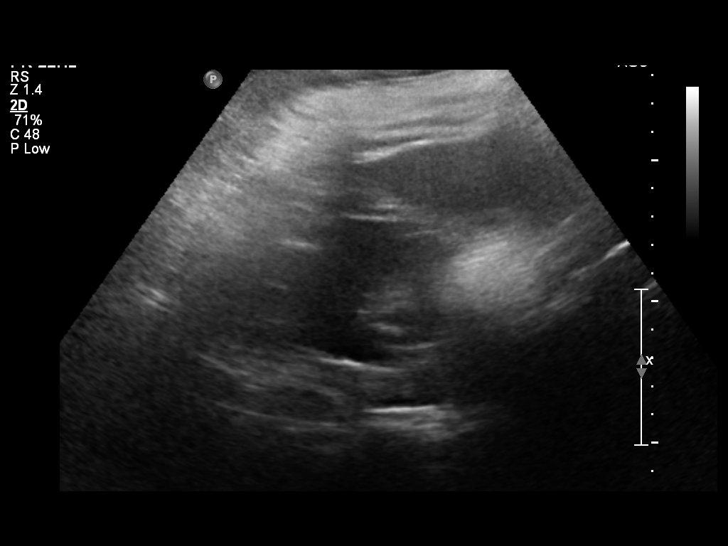
[im 11/22]
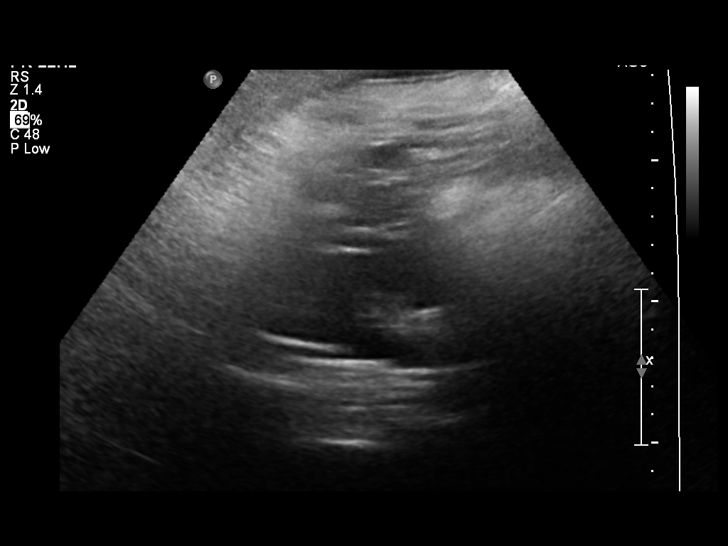
[im 12/22]
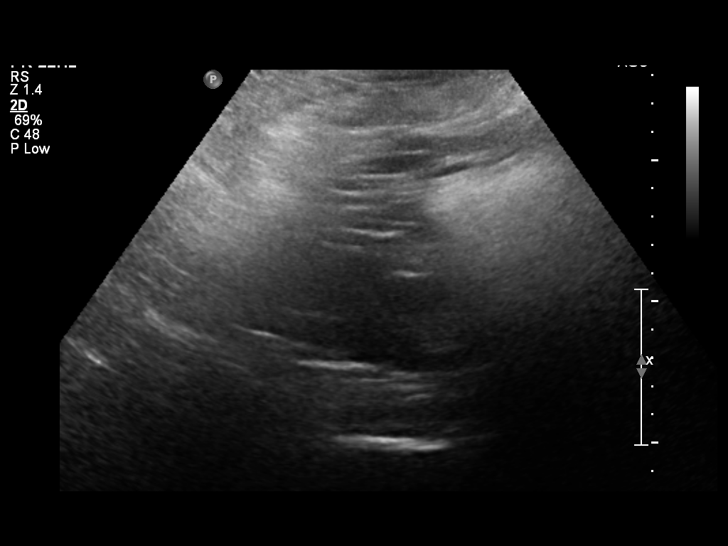
[im 14/22]
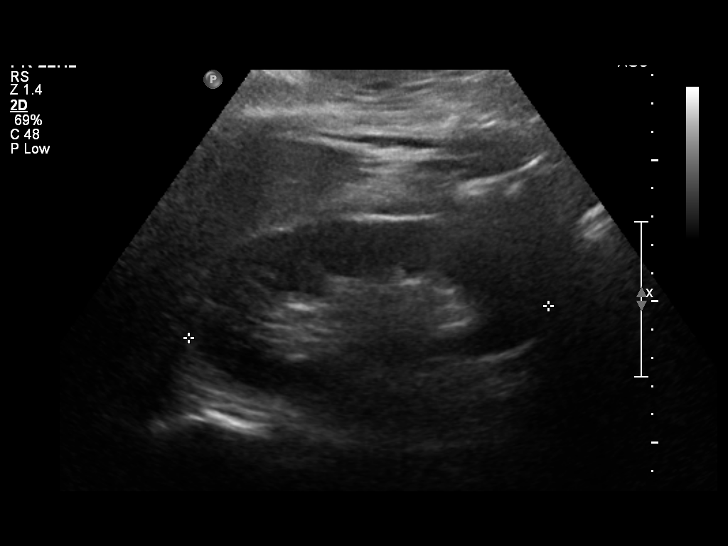
[im 15/22]
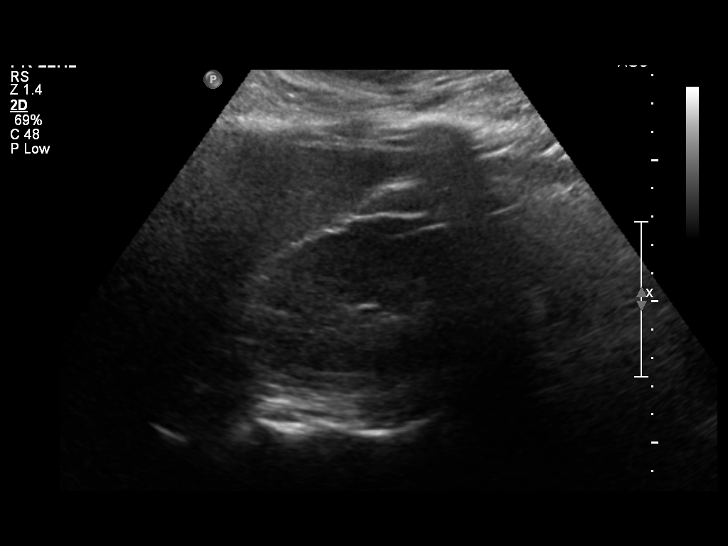
[im 17/22]
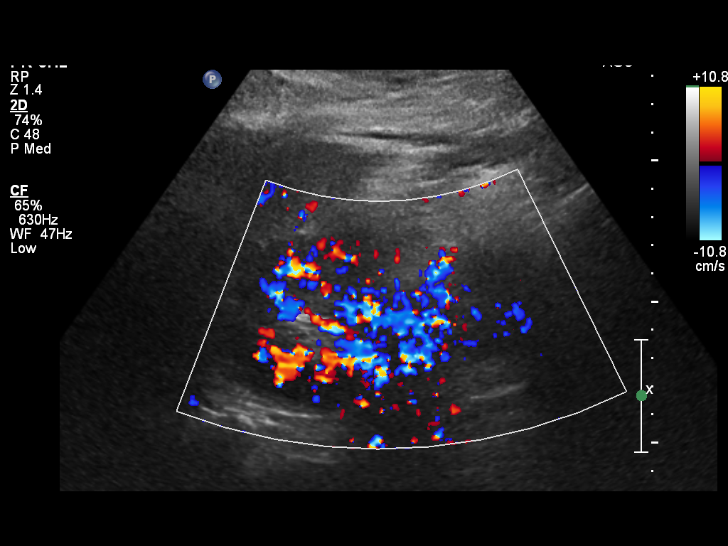
[im 19/22]
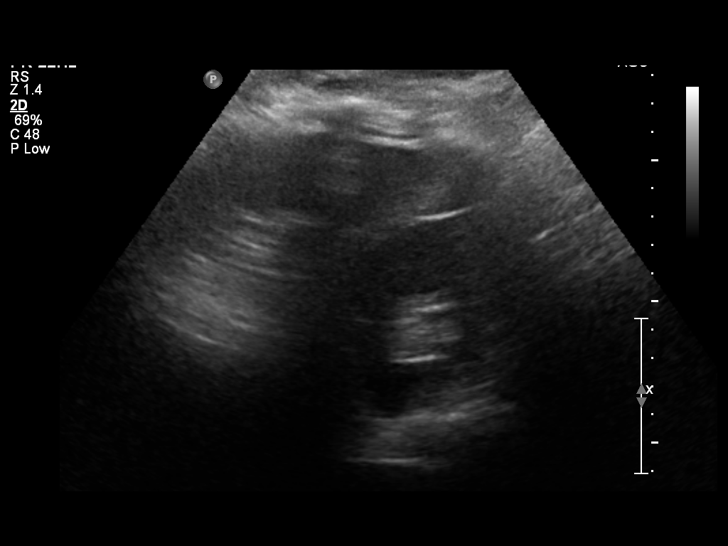
[im 20/22]
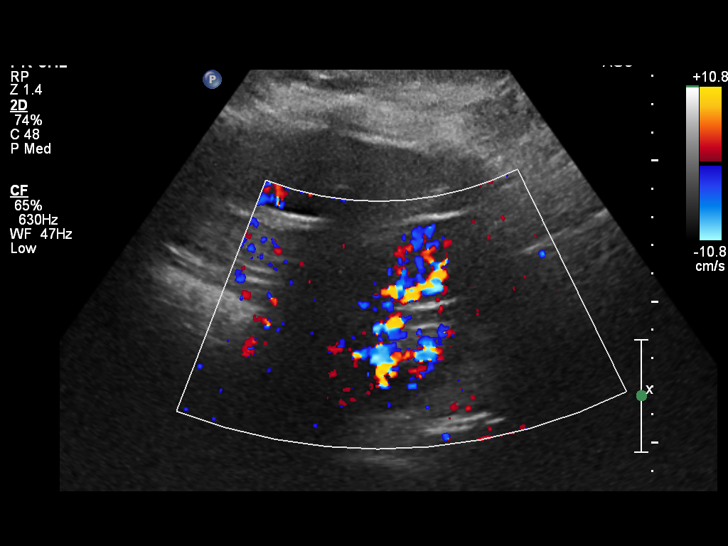
[im 22/22]
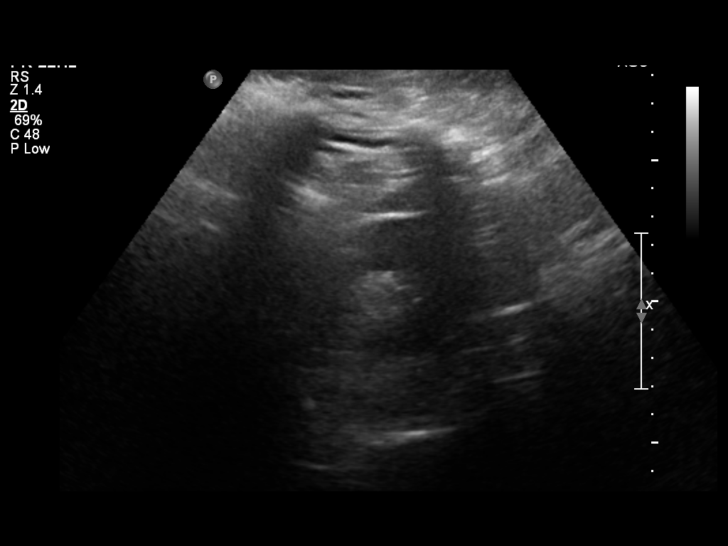

[14 of 22 positions shown; findings below may reference images not displayed]

FINDINGS: Right Kidney:

Length: 12 cm. Echogenicity within normal limits. No mass or
hydronephrosis visualized.

Left Kidney:

Length: 13 cm. Echogenicity within normal limits. No mass or
hydronephrosis visualized.

Bladder:

Not visualized due to recent surgery and probable decompression by
Foley catheter.
IMPRESSION: 1. No hydronephrosis or other sonographic abnormality.
2. Bladder not visualized.

## 2015-07-26 DIAGNOSIS — H52223 Regular astigmatism, bilateral: Secondary | ICD-10-CM | POA: Diagnosis not present

## 2015-07-26 DIAGNOSIS — H5213 Myopia, bilateral: Secondary | ICD-10-CM | POA: Diagnosis not present

## 2015-07-26 DIAGNOSIS — H1045 Other chronic allergic conjunctivitis: Secondary | ICD-10-CM | POA: Diagnosis not present

## 2021-01-16 ENCOUNTER — Encounter: Payer: Self-pay | Admitting: *Deleted

## 2021-01-17 ENCOUNTER — Encounter: Payer: Self-pay | Admitting: Cardiology

## 2021-01-17 ENCOUNTER — Telehealth: Payer: Self-pay | Admitting: Cardiology

## 2021-01-17 ENCOUNTER — Ambulatory Visit (INDEPENDENT_AMBULATORY_CARE_PROVIDER_SITE_OTHER): Payer: Self-pay | Admitting: Cardiology

## 2021-01-17 ENCOUNTER — Other Ambulatory Visit: Payer: Self-pay

## 2021-01-17 VITALS — BP 138/86 | HR 101 | Ht 66.0 in | Wt 282.8 lb

## 2021-01-17 DIAGNOSIS — R079 Chest pain, unspecified: Secondary | ICD-10-CM

## 2021-01-17 NOTE — Telephone Encounter (Signed)
Checking percert on the following patient for testing scheduled at [facility].     ECHO  02/08/2021

## 2021-01-17 NOTE — Progress Notes (Signed)
Clinical Summary Kim Sharp is a 35 y.o.female seen today as a new consult, referred by Dc McComb for the following medical problems.    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 From notes had surgical repair April 1989    2. Chest pain July 4 had COVID - July 14 high bp's SBP 150s - started on HCTZ x 1 week, bp's trended up further. Felt some irregular heart beats at the time - EKG with pcp showed SR, questionable inferior Qwaves  - started on propranolol 60mg  once daily, had low HRs.     - recentyly gets heaviness in chest, 6-7/10. Can occur at rest or with activity. Feels very anxious. Left arm tingling. No diaphoresis, no SOB. Can occasionally be positional. Heaviness lasts a few minutes. Occuars about once a week. No palpitations associated.  - walks 30 minutes, 3-4 days. No exertional symptoms   CAD risk factors: LDL 180, borderline HTN, father CVA age 17   3. HTN - home bp's 120/70s      Past Medical History:  Diagnosis Date   Atrial septal defect and mitral stenosis    Cystic fibrosis carrier    H/O varicella    Headache(784.0)    Hyperlipidemia    Intermittent palpitations    Obesity    Pregnancy induced hypertension    Primary insomnia    VSD (ventricular septal defect)      Allergies  Allergen Reactions   Codeine Nausea Only and Rash   Penicillins Nausea Only   Stadol [Butorphanol] Other (See Comments)    Reaction:  Hallucinations      Current Outpatient Medications  Medication Sig Dispense Refill   cephALEXin (KEFLEX) 250 MG capsule Take 1 capsule (250 mg total) by mouth 4 (four) times daily. 20 capsule 0   famotidine (PEPCID) 20 MG tablet Take 20 mg by mouth daily as needed for heartburn or indigestion.      ferrous sulfate (FERROUSUL) 325 (65 FE) MG tablet Take 1 tablet (325 mg total) by mouth 2 (two) times daily with a meal. 60 tablet 3   fluticasone (FLONASE) 50 MCG/ACT nasal spray Place 2 sprays into both  nostrils daily. 16 g 6   HYDROmorphone (DILAUDID) 2 MG tablet Take 0.5 tablets (1 mg total) by mouth every 3 (three) hours as needed for severe pain. 30 tablet 0   ibuprofen (ADVIL,MOTRIN) 600 MG tablet Take 1 tablet (600 mg total) by mouth every 6 (six) hours as needed for moderate pain. 30 tablet 1   Prenatal Vit-Fe Fumarate-FA (PRENATAL MULTIVITAMIN) TABS tablet Take 1 tablet by mouth at bedtime.     propranolol ER (INDERAL LA) 60 MG 24 hr capsule Take 60 mg by mouth daily.     No current facility-administered medications for this visit.     Past Surgical History:  Procedure Laterality Date   CARDIAC SURGERY     asd and vsd repair   CESAREAN SECTION  01/26/2012   Procedure: CESAREAN SECTION;  Surgeon: 01/28/2012, MD;  Location: [facility];  Service: Obstetrics;  Laterality: N/A;  Primary Cesarean Section Delivery Baby Boy @ [phone], Apgar 9/9    CESAREAN SECTION N/A 04/20/2014   Procedure: CESAREAN SECTION;  Surgeon: 04/22/2014, MD;  Location: [facility];  Service: Obstetrics;  Laterality: N/A;   NM MISC PROCEDURE  05/08/1987   open heart     Allergies  Allergen Reactions   Codeine Nausea Only and Rash   Penicillins  Nausea Only   Stadol [Butorphanol] Other (See Comments)    Reaction:  Hallucinations       Family History  Problem Relation Age of Onset   Hypertension Father    Stroke Father    Heart disease Paternal Grandmother      Social History Kim Sharp reports that she has never smoked. She has never used smokeless tobacco. Kim Sharp reports no history of alcohol use.   Review of Systems CONSTITUTIONAL: No weight loss, fever, chills, weakness or fatigue.  HEENT: Eyes: No visual loss, blurred vision, double vision or yellow sclerae.No hearing loss, sneezing, congestion, runny nose or sore throat.  SKIN: No rash or itching.  CARDIOVASCULAR: per hpi RESPIRATORY: No shortness of breath, cough or sputum.  GASTROINTESTINAL: No anorexia, nausea, vomiting or  diarrhea. No abdominal pain or blood.  GENITOURINARY: No burning on urination, no polyuria NEUROLOGICAL: No headache, dizziness, syncope, paralysis, ataxia, numbness or tingling in the extremities. No change in bowel or bladder control.  MUSCULOSKELETAL: No muscle, back pain, joint pain or stiffness.  LYMPHATICS: No enlarged nodes. No history of splenectomy.  PSYCHIATRIC: No history of depression or anxiety.  ENDOCRINOLOGIC: No reports of sweating, cold or heat intolerance. No polyuria or polydipsia.  Marland Kitchen   Physical Examination Today's Vitals   01/17/21 1440  BP: 138/86  Pulse: (!) 101  SpO2: 100%  Weight: 282 lb 12.8 oz (128.3 kg)  Height: 5\' 6"  (1.676 m)   Body mass index is 45.65 kg/m.  Gen: resting comfortably, no acute distress HEENT: no scleral icterus, pupils equal round and reactive, no palptable cervical adenopathy,  CV: RRR, no m/r/g no jvd Resp: Clear to auscultation bilaterally GI: abdomen is soft, non-tender, non-distended, normal bowel sounds, no hepatosplenomegaly MSK: extremities are warm, no edema.  Skin: warm, no rash Neuro:  no focal deficits Psych: appropriate affect   Diagnostic Studies 01/2014 echo  Echo Study Conclusions   - Left ventricle: The cavity size was normal. Wall thickness was    normal. Systolic function was normal. The estimated ejection    fraction was in the range of 55% to 60%. Wall motion was normal;    there were no regional wall motion abnormalities. Left    ventricular diastolic function parameters were normal. No    evidence of residual VSD.  - Mitral valve: There was mild regurgitation. The jet appears to be    very eccentric and posterior, this may lead to underestimation of    severity.  - Left atrium: The atrium was moderately dilated.  - Pulmonary veins: Pulse wave Doppler shows systolic flow reversal    in the sampled RUPV, unclear etiology. There is no evidence of    severe MR or severe diastolic dysfunction. This may  be due to    eccentric mild MR jet directed at the sampled PV.  - Right atrium: The atrium was mildly dilated.  - Atrial septum: No defect or patent foramen ovale was identified.  - From notes history of congenital ASD and VSD repair. No evidence    of residual defects.  - Patient was brought back for additional images, please note    additional views at end of study.     Assessment and Plan  Chest pain - unclear etiolgy - will obtain an echo to evaluate for any undelrying structural heart disease  Pending echo may consider GXT - if negative cardiac workup could possibly secondary to anxiety/panic attacks, would look to exclude any hear etiology first.  - EKG  today shows NSR, no acute ischemic changes     Antoine Poche, M.D.

## 2021-01-17 NOTE — Patient Instructions (Addendum)
Medication Instructions:  Continue all current medications.  Labwork: none  Testing/Procedures: Your physician has requested that you have an echocardiogram. Echocardiography is a painless test that uses sound waves to create images of your heart. It provides your doctor with information about the size and shape of your heart and how well your heart's chambers and valves are working. This procedure takes approximately one hour. There are no restrictions for this procedure. Office will contact with results via phone or letter.    Follow-Up: Follow up pending test results    Any Other Special Instructions Will Be Listed Below (If Applicable).   If you need a refill on your cardiac medications before your next appointment, please call your pharmacy.

## 2021-02-08 ENCOUNTER — Ambulatory Visit (HOSPITAL_COMMUNITY)
Admission: RE | Admit: 2021-02-08 | Discharge: 2021-02-08 | Disposition: A | Discharge status: Home / Self Care | Payer: BC Managed Care – PPO | Source: Ambulatory Visit | Attending: Cardiology | Admitting: Cardiology

## 2021-02-08 ENCOUNTER — Other Ambulatory Visit: Payer: Self-pay

## 2021-02-08 DIAGNOSIS — R079 Chest pain, unspecified: Secondary | ICD-10-CM | POA: Diagnosis present

## 2021-02-08 LAB — ECHOCARDIOGRAM COMPLETE
Area-P 1/2: 3.03 cm2
S' Lateral: 2.8 cm

## 2021-02-08 NOTE — Progress Notes (Signed)
*  PRELIMINARY RESULTS* Echocardiogram 2D Echocardiogram has been performed.  Stacey Drain 02/08/2021, 9:08 AM

## 2021-02-15 ENCOUNTER — Telehealth: Payer: Self-pay | Admitting: *Deleted

## 2021-02-15 ENCOUNTER — Encounter: Payer: Self-pay | Admitting: *Deleted

## 2021-02-15 DIAGNOSIS — R079 Chest pain, unspecified: Secondary | ICD-10-CM

## 2021-02-15 NOTE — Telephone Encounter (Signed)
-----   Message from Antoine Poche, MD sent at 02/14/2021  7:29 AM EDT ----- Overall normal echo. Can we order a GXT for chest pain please.  Dominga Ferry MD

## 2021-02-15 NOTE — Telephone Encounter (Signed)
Lesle Chris, LPN  17/49/4496  5:34 PM EDT Back to Top    Notified, copy to pcp.  She agrees to doing the GXT.  Order will be put in epic & fwd to pcc for scheduling.  Instruction letter sent to patient via my chart now.

## 2021-03-07 ENCOUNTER — Encounter (HOSPITAL_COMMUNITY): Payer: BC Managed Care – PPO

## 2022-05-16 ENCOUNTER — Ambulatory Visit: Payer: Self-pay | Admitting: Family Medicine
